# Patient Record
Sex: Male | Born: 1945 | Race: White | Hispanic: No | Marital: Married | Smoking: Light tobacco smoker
Health system: Southern US, Community
[De-identification: ages and names within clinical notes are randomized; demographics above are authoritative.]

## PROBLEM LIST (undated history)

## (undated) DIAGNOSIS — Z87442 Personal history of urinary calculi: Secondary | ICD-10-CM

## (undated) DIAGNOSIS — C801 Malignant (primary) neoplasm, unspecified: Secondary | ICD-10-CM

## (undated) DIAGNOSIS — F419 Anxiety disorder, unspecified: Secondary | ICD-10-CM

## (undated) DIAGNOSIS — E119 Type 2 diabetes mellitus without complications: Secondary | ICD-10-CM

## (undated) DIAGNOSIS — R011 Cardiac murmur, unspecified: Secondary | ICD-10-CM

## (undated) DIAGNOSIS — K219 Gastro-esophageal reflux disease without esophagitis: Secondary | ICD-10-CM

## (undated) DIAGNOSIS — I10 Essential (primary) hypertension: Secondary | ICD-10-CM

## (undated) DIAGNOSIS — M1990 Unspecified osteoarthritis, unspecified site: Secondary | ICD-10-CM

## (undated) DIAGNOSIS — R0600 Dyspnea, unspecified: Secondary | ICD-10-CM

## (undated) DIAGNOSIS — K7460 Unspecified cirrhosis of liver: Secondary | ICD-10-CM

## (undated) DIAGNOSIS — J189 Pneumonia, unspecified organism: Secondary | ICD-10-CM

## (undated) HISTORY — DX: Essential (primary) hypertension: I10

## (undated) HISTORY — PX: CARDIAC CATHETERIZATION: SHX172

## (undated) HISTORY — PX: CARDIAC VALVE REPLACEMENT: SHX585

## (undated) HISTORY — PX: COLON SURGERY: SHX602

---

## 2018-12-23 ENCOUNTER — Ambulatory Visit: Payer: Medicare Other | Admitting: Cardiovascular Disease

## 2019-01-19 ENCOUNTER — Encounter: Payer: Self-pay | Admitting: Cardiovascular Disease

## 2019-01-19 ENCOUNTER — Ambulatory Visit (INDEPENDENT_AMBULATORY_CARE_PROVIDER_SITE_OTHER): Payer: Medicare Other | Admitting: Cardiovascular Disease

## 2019-01-19 ENCOUNTER — Other Ambulatory Visit: Payer: Self-pay

## 2019-01-19 VITALS — BP 152/74 | HR 107 | Ht 70.0 in | Wt 160.6 lb

## 2019-01-19 DIAGNOSIS — Z952 Presence of prosthetic heart valve: Secondary | ICD-10-CM

## 2019-01-19 DIAGNOSIS — Z01812 Encounter for preprocedural laboratory examination: Secondary | ICD-10-CM | POA: Diagnosis not present

## 2019-01-19 HISTORY — DX: Presence of prosthetic heart valve: Z95.2

## 2019-01-19 MED ORDER — SODIUM CHLORIDE 0.9% FLUSH: 3.0000 mL | Freq: Two times a day (BID) | INTRAVENOUS | Status: DC

## 2019-01-19 NOTE — Assessment & Plan Note (Signed)
Mr. Lacinda Axon was referred to me by Dr. 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  He had bioprosthetic AVR in [place] 4/15.  Apparently he underwent cardiac cath prior to that revealing normal coronary arteries.  He has had increasing dyspnea on exertion and chest pressure.  2D echo performed by Dr. Annie Main revealed normal EF with a increase in his gradient from 20 to 40 mmHg.  He was referred for TEE, right left heart cath.  I have reviewed the risks, indications, and alternatives to cardiac catheterization, possible angioplasty, and stenting with the patient. Risks include but are not limited to bleeding, infection, vascular injury, stroke, myocardial infection, arrhythmia, kidney injury, radiation-related injury in the case of prolonged fluoroscopy use, emergency cardiac surgery, and death. The patient understands the risks of serious complication is 1-2 in 123XX123 with diagnostic cardiac cath and 1-2% or less with angioplasty/stenting.

## 2019-01-19 NOTE — Progress Notes (Signed)
01/19/2019 Tony [name]   11/15/1945  [id number]  Primary Physician Windell Hummingbird, PA-C Primary Cardiologist: Lorretta Harp MD Lupe Carney, [place]  HPI:  Tony [name] is a 73 y.o. married Caucasian male father of 2 children, grandfather of 95 grandchildren referred by Dr. Claudie Leach for TEE, right left heart cath because of progressive prosthetic aortic valves stenosis and associated symptoms of dyspnea and chest pressure.  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  Mr. Factors include tobacco abuse currently smoking 2 cigars a week previously up to 8-10.  He has treated hypertension, diabetes and hyperlipidemia.  He is never had a heart attack or stroke.  His brother did have bicuspid aortic stenosis and AVR at age 6.  2D echo performed by Dr. Claudie Leach revealed preserved LV function with a worsening aortic valve gradient.  Current Meds  Medication Sig  . aspirin EC 81 MG tablet Take by mouth.  . Coenzyme Q10 10 MG capsule Take by mouth.  . escitalopram (LEXAPRO) 20 MG tablet Take by mouth.  . Evolocumab (REPATHA SURECLICK) [id number] MG/ML SOAJ Inject into the skin.  . hydrochlorothiazide (HYDRODIURIL) 12.5 MG tablet Take by mouth.  . insulin glargine (LANTUS) 100 UNIT/ML injection Inject into the skin.  . metFORMIN (GLUCOPHAGE) 1000 MG tablet Take by mouth.  . metoprolol succinate (TOPROL XL) 50 MG 24 hr tablet Take by mouth.     Allergies  Allergen Reactions  . Codeine Other (See Comments) and Rash    Hallucinations     Social History   Socioeconomic History  . Marital status: Married    Spouse name: Not on file  . Number of children: Not on file  . Years of education: Not on file  . Highest education level: Not on file  Occupational History  . Not on file  Social Needs  . Financial resource strain: Not on file  . Food insecurity    Worry: Not on file    Inability: Not on file  . Transportation needs   Medical: Not on file    Non-medical: Not on file  Tobacco Use  . Smoking status: Light Tobacco Smoker  . Smokeless tobacco: Never Used  . Tobacco comment: cigars 2 a week   Substance and Sexual Activity  . Alcohol use: Not on file  . Drug use: Not on file  . Sexual activity: Not on file  Lifestyle  . Physical activity    Days per week: Not on file    Minutes per session: Not on file  . Stress: Not on file  Relationships  . Social Herbalist on phone: Not on file    Gets together: Not on file    Attends religious service: Not on file    Active member of club or organization: Not on file    Attends meetings of clubs or organizations: Not on file    Relationship status: Not on file  . Intimate partner violence    Fear of current or ex partner: Not on file    Emotionally abused: Not on file    Physically abused: Not on file    Forced sexual activity: Not on file  Other Topics Concern  . Not on file  Social History Narrative  . Not on file     Review of Systems: General: negative for chills, fever, night sweats or weight changes.  Cardiovascular: negative for chest pain,  dyspnea on exertion, edema, orthopnea, palpitations, paroxysmal nocturnal dyspnea or shortness of breath Dermatological: negative for rash Respiratory: negative for cough or wheezing Urologic: negative for hematuria Abdominal: negative for nausea, vomiting, diarrhea, bright red blood per rectum, melena, or hematemesis Neurologic: negative for visual changes, syncope, or dizziness All other systems reviewed and are otherwise negative except as noted above.    Blood pressure (!) 152/74, pulse (!) 107, height 5\' 10"  (1.778 m), weight 160 lb 9.6 oz (72.8 kg), SpO2 94 %.  General appearance: alert and no distress Neck: no adenopathy, no JVD, supple, symmetrical, trachea midline, thyroid not enlarged, symmetric, no tenderness/mass/nodules and Left carotid bruit versus transmitted murmur Lungs: clear to  auscultation bilaterally Heart: 2/6 systolic ejection murmur at the base consistent with aortic stenosis Extremities: extremities normal, atraumatic, no cyanosis or edema Pulses: 2+ and symmetric Skin: Skin color, texture, turgor normal. No rashes or lesions Neurologic: Alert and oriented X 3, normal strength and tone. Normal symmetric reflexes. Normal coordination and gait  EKG sinus tachycardia at 107 with evidence of LVH with repolarization changes.  I personally reviewed this EKG.  ASSESSMENT AND PLAN:   H/O aortic valve replacement [name] was referred to me by Dr. 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  He had bioprosthetic AVR in [place] 4/15.  Apparently he underwent cardiac cath prior to that revealing normal coronary arteries.  He has had increasing dyspnea on exertion and chest pressure.  2D echo performed by Dr. Annie Main revealed normal EF with a increase in his gradient from 20 to 40 mmHg.  He was referred for TEE, right left heart cath.  I have reviewed the risks, indications, and alternatives to cardiac catheterization, possible angioplasty, and stenting with the patient. Risks include but are not limited to bleeding, infection, vascular injury, stroke, myocardial infection, arrhythmia, kidney injury, radiation-related injury in the case of prolonged fluoroscopy use, emergency cardiac surgery, and death. The patient understands the risks of serious complication is 1-2 in 123XX123 with diagnostic cardiac cath and 1-2% or less with angioplasty/stenting.       Lorretta Harp MD FACP,FACC,FAHA, [facility] 01/19/2019 2:14 PM

## 2019-01-19 NOTE — Patient Instructions (Addendum)
Medication Instructions:  Your physician recommends that you continue on your current medications as directed. Please refer to the Current Medication list given to you today.  If you need a refill on your cardiac medications before your next appointment, please call your pharmacy.   Lab work: CBC, BMET If you have labs (blood work) drawn today and your tests are completely normal, you will receive your results only by: [place] (if you have MyChart) OR A paper copy in the mail If you have any lab test that is abnormal or we need to change your treatment, we will call you to review the results.  Testing/Procedures: Your physician has requested that you have a cardiac catheterization. Cardiac catheterization is used to diagnose and/or treat various heart conditions. Doctors may recommend this procedure for a number of different reasons. The most common reason is to evaluate chest pain. Chest pain can be a symptom of coronary artery disease (CAD), and cardiac catheterization can show whether plaque is narrowing or blocking your heart's arteries. This procedure is also used to evaluate the valves, as well as measure the blood flow and oxygen levels in different parts of your heart. For further information please visit [email or web address]. Please follow instruction sheet, as given.  AND  Your physician has requested that you have a TEE. During a TEE, sound waves are used to create images of your heart. It provides your doctor with information about the size and shape of your heart and how well your heart's chambers and valves are working. In this test, a transducer is attached to the end of a flexible tube that's guided down your throat and into your esophagus (the tube leading from you mouth to your stomach) to get a more detailed image of your heart. You are not awake for the procedure. Please see the instruction sheet given to you today. For further information please visit  [email or web address].   Follow-Up: At [facility], you and your health needs are our priority.  As part of our continuing mission to provide you with exceptional heart care, we have created designated Provider Care Teams.  These Care Teams include your primary Cardiologist (physician) and Advanced Practice Providers (APPs -  Physician Assistants and Nurse Practitioners) who all work together to provide you with the care you need, when you need it. You may see Dr Gwenlyn Found or one of the following Advanced Practice Providers on your designated Care Team:    Kerin Ransom, PA-C  [place], [place]  Coletta Memos, [place]  Any Other Special Instructions Will Be Listed Below (If Applicable).  You are scheduled for a TEE/Cardioversion/TEE Cardioversion on 01/29/2019 with Dr. Stanford Breed.  Please arrive at the [facility] (Main Entrance A) at [facility]: [address] [place], [place] [address] at 6:30am am. (1 hour prior to procedure unless lab work is needed; if lab work is needed arrive 1.5 hours ahead)  DIET: Nothing to eat or drink after midnight except a sip of water with medications (see medication instructions below)  Medication Instructions: Hold Diabetic Medications: Lantus, Glucophage  Continue your anticoagulant: Aspirin You will need to continue your anticoagulant after your procedure until you  are told by your  Provider that it is safe to stop   Labs: BMET and CBC  You must have a responsible person to drive you home and stay in the waiting area during your procedure. Failure to do so could result in cancellation.  Bring your insurance cards.  *Special Note: Every effort is  made to have your procedure done on time. Occasionally there are emergencies that occur at the hospital that may cause delays. Please be patient if a delay does occur.   You are scheduled for a Cardiac Catheterization on Thursday, December 3 with Dr. Quay Burow.  1. Please arrive at the [facility] (Main Entrance A) at [facility]: [address] [place], [place] [address] at 11:30 AM (This time is two hours before your procedure to ensure your preparation). Free valet parking service is available.   Special note: Every effort is made to have your procedure done on time. Please understand that emergencies sometimes delay scheduled procedures.  2. Diet: Do not eat solid foods after midnight.  The patient may have clear liquids until 5am upon the day of the procedure.  3. Labs: CBC, BMET  4. Medication instructions in preparation for your procedure: Hold Metformin and Lantus  On the morning of your procedure, take your Aspirin and any morning medicines NOT listed above.  You may use sips of water.  5. Plan for one night stay--bring personal belongings. 6. Bring a current list of your medications and current insurance cards. 7. You MUST have a responsible person to drive you home. 8. Someone MUST be with you the first 24 hours after you arrive home or your discharge will be delayed. 9. Please wear clothes that are easy to get on and off and wear slip-on shoes.  YOU WILL HAVE TO BE TESTED FOR COVID ON Monday November 30TH AT 12:50PM. THIS IS LOCATED AT [facility]. ([place]) [place], [place], [id number] Thank you for allowing [place] to care for you!   -- [facility]

## 2019-01-20 LAB — CBC
Hematocrit: 44.1 % (ref 37.5–51.0)
Hemoglobin: 14.9 g/dL (ref 13.0–17.7)
MCH: 31.2 pg (ref 26.6–33.0)
MCHC: 33.8 g/dL (ref 31.5–35.7)
MCV: 93 fL (ref 79–97)
Platelets: 172 10*3/uL (ref 150–450)
RBC: 4.77 x10E6/uL (ref 4.14–5.80)
RDW: 13.7 % (ref 11.6–15.4)
WBC: 7.8 10*3/uL (ref 3.4–10.8)

## 2019-01-20 LAB — BASIC METABOLIC PANEL
BUN/Creatinine Ratio: 12 (ref 10–24)
BUN: 10 mg/dL (ref 8–27)
CO2: 23 mmol/L (ref 20–29)
Calcium: 9.5 mg/dL (ref 8.6–10.2)
Chloride: 96 mmol/L (ref 96–106)
Creatinine, Ser: 0.83 mg/dL (ref 0.76–1.27)
GFR calc Af Amer: 102 mL/min/{1.73_m2} (ref >59)
GFR calc non Af Amer: 89 mL/min/{1.73_m2} (ref >59)
Glucose: 98 mg/dL (ref 65–99)
Potassium: 4.7 mmol/L (ref 3.5–5.2)
Sodium: 138 mmol/L (ref 134–144)

## 2019-01-26 ENCOUNTER — Encounter: Payer: Self-pay | Admitting: *Deleted

## 2019-01-26 ENCOUNTER — Other Ambulatory Visit (HOSPITAL_COMMUNITY): Payer: Medicare Other

## 2019-01-27 ENCOUNTER — Other Ambulatory Visit (HOSPITAL_COMMUNITY)
Admission: RE | Admit: 2019-01-27 | Discharge: 2019-01-27 | Disposition: A | Discharge status: Home / Self Care | Payer: Medicare Other | Source: Ambulatory Visit | Attending: Cardiovascular Disease | Admitting: Cardiovascular Disease

## 2019-01-27 DIAGNOSIS — Z01812 Encounter for preprocedural laboratory examination: Secondary | ICD-10-CM | POA: Diagnosis present

## 2019-01-27 DIAGNOSIS — Z20828 Contact with and (suspected) exposure to other viral communicable diseases: Secondary | ICD-10-CM | POA: Insufficient documentation

## 2019-01-28 ENCOUNTER — Telehealth: Payer: Self-pay | Admitting: *Deleted

## 2019-01-28 LAB — SARS CORONAVIRUS 2 (TAT 6-24 HRS): SARS Coronavirus 2: NEGATIVE

## 2019-01-28 NOTE — Telephone Encounter (Signed)
Pt contacted pre-catheterization scheduled at [facility] for: Thursday January 29, 2019 1:30 PM Verified arrival time and place: [place] Entrance A [facility]) at: 6:30 AM/TEE 7:30 AM   Nothing to eat or drink  after midnight prior to cath, sips of water to take medications Contrast allergy: no  Hold: Metformin-day of procedure and 48 hours post procedure. Insulin-AM of procedure. HCTZ-AM of procedure.  Except hold medications AM meds can be  taken pre-cath with sip of water including: ASA 81 mg   Confirmed patient has responsible adult to drive home post procedure and observe 24 hours after arriving home: yes  Currently, due to Covid-19 pandemic, only one support person will be allowed with patient. Must be the same support person for that patient's entire stay, will be screened and required to wear a mask. They will be asked to wait in the waiting room for the duration of the patient's stay.  Patients are required to wear a mask when they enter the hospital.      COVID-19 Pre-Screening Questions:  . In the past 7 to 10 days have you had a cough,  shortness of breath, headache, congestion, fever (100 or greater) body aches, chills, sore throat, or sudden loss of taste or sense of smell? no . Have you been around anyone with known Covid 19? no . Have you been around anyone who is awaiting Covid 19 test results in the past 7 to 10 days? no . Have you been around anyone who has been exposed to Covid 19, or has mentioned symptoms of Covid 19 within the past 7 to 10 days? no    I reviewed procedure/mask/visitor instructions, Covid-19 screening questions with patient's wife, Stanton Kidney, she verbalized understanding, thanked me for call.

## 2019-01-28 NOTE — Telephone Encounter (Signed)
Dr. Kennon Holter patient

## 2019-01-28 NOTE — Telephone Encounter (Signed)
Pt's wife (DPR), Stanton Kidney, states patient is currently being evaluated for dementia/cognitive impairment. She is concerned that patient may not be able to understand questions and answer correctly. She is concerned that patient may not understand any medical information or instructions he is given.  Pt's wife advised I will forward to Dr Gwenlyn Found and [facility] Cath Lab for their review.

## 2019-01-29 ENCOUNTER — Ambulatory Visit (HOSPITAL_COMMUNITY)
Admission: RE | Admit: 2019-01-29 | Discharge: 2019-01-29 | Disposition: A | Discharge status: Home / Self Care | Payer: Medicare Other | Attending: Cardiology | Admitting: Cardiology

## 2019-01-29 ENCOUNTER — Other Ambulatory Visit: Payer: Self-pay

## 2019-01-29 ENCOUNTER — Ambulatory Visit (HOSPITAL_BASED_OUTPATIENT_CLINIC_OR_DEPARTMENT_OTHER)
Admission: RE | Admit: 2019-01-29 | Discharge: 2019-01-29 | Disposition: A | Discharge status: Home / Self Care | Payer: Medicare Other | Source: Home / Self Care | Attending: Cardiology | Admitting: Cardiology

## 2019-01-29 ENCOUNTER — Encounter (HOSPITAL_COMMUNITY)
Admission: RE | Disposition: A | Discharge status: Home / Self Care | Payer: Self-pay | Source: Home / Self Care | Attending: Cardiology

## 2019-01-29 ENCOUNTER — Encounter (HOSPITAL_COMMUNITY): Payer: Self-pay

## 2019-01-29 ENCOUNTER — Encounter (HOSPITAL_COMMUNITY)
Admission: RE | Disposition: A | Discharge status: Home / Self Care | Payer: Medicare Other | Source: Home / Self Care | Attending: Cardiology

## 2019-01-29 DIAGNOSIS — Z79899 Other long term (current) drug therapy: Secondary | ICD-10-CM | POA: Diagnosis not present

## 2019-01-29 DIAGNOSIS — Z952 Presence of prosthetic heart valve: Secondary | ICD-10-CM

## 2019-01-29 DIAGNOSIS — E119 Type 2 diabetes mellitus without complications: Secondary | ICD-10-CM | POA: Diagnosis not present

## 2019-01-29 DIAGNOSIS — I080 Rheumatic disorders of both mitral and aortic valves: Secondary | ICD-10-CM | POA: Diagnosis not present

## 2019-01-29 DIAGNOSIS — Z953 Presence of xenogenic heart valve: Secondary | ICD-10-CM | POA: Diagnosis not present

## 2019-01-29 DIAGNOSIS — I10 Essential (primary) hypertension: Secondary | ICD-10-CM | POA: Insufficient documentation

## 2019-01-29 DIAGNOSIS — F17290 Nicotine dependence, other tobacco product, uncomplicated: Secondary | ICD-10-CM | POA: Insufficient documentation

## 2019-01-29 DIAGNOSIS — E785 Hyperlipidemia, unspecified: Secondary | ICD-10-CM | POA: Diagnosis not present

## 2019-01-29 DIAGNOSIS — Z885 Allergy status to narcotic agent status: Secondary | ICD-10-CM | POA: Insufficient documentation

## 2019-01-29 DIAGNOSIS — I340 Nonrheumatic mitral (valve) insufficiency: Secondary | ICD-10-CM | POA: Diagnosis not present

## 2019-01-29 DIAGNOSIS — Z01812 Encounter for preprocedural laboratory examination: Secondary | ICD-10-CM

## 2019-01-29 DIAGNOSIS — Z7982 Long term (current) use of aspirin: Secondary | ICD-10-CM | POA: Diagnosis not present

## 2019-01-29 DIAGNOSIS — Z794 Long term (current) use of insulin: Secondary | ICD-10-CM | POA: Insufficient documentation

## 2019-01-29 DIAGNOSIS — I350 Nonrheumatic aortic (valve) stenosis: Secondary | ICD-10-CM | POA: Diagnosis not present

## 2019-01-29 HISTORY — PX: TEE WITHOUT CARDIOVERSION: SHX5443

## 2019-01-29 HISTORY — DX: Type 2 diabetes mellitus without complications: E11.9

## 2019-01-29 HISTORY — PX: RIGHT/LEFT HEART CATH AND CORONARY ANGIOGRAPHY: CATH118266

## 2019-01-29 LAB — POCT I-STAT, CHEM 8
BUN: 12 mg/dL (ref 8–23)
Calcium, Ion: 1.16 mmol/L (ref 1.15–1.40)
Chloride: 93 mmol/L — ABNORMAL LOW (ref 98–111)
Creatinine, Ser: 0.6 mg/dL — ABNORMAL LOW (ref 0.61–1.24)
Glucose, Bld: 203 mg/dL — ABNORMAL HIGH (ref 70–99)
HCT: 38 % — ABNORMAL LOW (ref 39.0–52.0)
Hemoglobin: 12.9 g/dL — ABNORMAL LOW (ref 13.0–17.0)
Potassium: 3.9 mmol/L (ref 3.5–5.1)
Sodium: 130 mmol/L — ABNORMAL LOW (ref 135–145)
TCO2: 24 mmol/L (ref 22–32)

## 2019-01-29 LAB — POCT I-STAT EG7
Acid-base deficit: 1 mmol/L (ref 0.0–2.0)
Bicarbonate: 24.3 mmol/L (ref 20.0–28.0)
Bicarbonate: 25.6 mmol/L (ref 20.0–28.0)
Calcium, Ion: 1.12 mmol/L — ABNORMAL LOW (ref 1.15–1.40)
Calcium, Ion: 1.16 mmol/L (ref 1.15–1.40)
HCT: 41 % (ref 39.0–52.0)
HCT: 41 % (ref 39.0–52.0)
Hemoglobin: 13.9 g/dL (ref 13.0–17.0)
Hemoglobin: 13.9 g/dL (ref 13.0–17.0)
O2 Saturation: 67 %
O2 Saturation: 69 %
Potassium: 4.1 mmol/L (ref 3.5–5.1)
Potassium: 4.2 mmol/L (ref 3.5–5.1)
Sodium: 136 mmol/L (ref 135–145)
Sodium: 137 mmol/L (ref 135–145)
TCO2: 26 mmol/L (ref 22–32)
TCO2: 27 mmol/L (ref 22–32)
pCO2, Ven: 41.4 mmHg — ABNORMAL LOW (ref 44.0–60.0)
pCO2, Ven: 42.9 mmHg — ABNORMAL LOW (ref 44.0–60.0)
pH, Ven: 7.377 (ref 7.250–7.430)
pH, Ven: 7.384 (ref 7.250–7.430)
pO2, Ven: 35 mmHg (ref 32.0–45.0)
pO2, Ven: 37 mmHg (ref 32.0–45.0)

## 2019-01-29 LAB — GLUCOSE, CAPILLARY: Glucose-Capillary: 256 mg/dL — ABNORMAL HIGH (ref 70–99)

## 2019-01-29 LAB — POCT I-STAT 7, (LYTES, BLD GAS, ICA,H+H)
Acid-base deficit: 3 mmol/L — ABNORMAL HIGH (ref 0.0–2.0)
Bicarbonate: 22.2 mmol/L (ref 20.0–28.0)
Calcium, Ion: 1.14 mmol/L — ABNORMAL LOW (ref 1.15–1.40)
HCT: 38 % — ABNORMAL LOW (ref 39.0–52.0)
Hemoglobin: 12.9 g/dL — ABNORMAL LOW (ref 13.0–17.0)
O2 Saturation: 98 %
Potassium: 3.9 mmol/L (ref 3.5–5.1)
Sodium: 130 mmol/L — ABNORMAL LOW (ref 135–145)
TCO2: 23 mmol/L (ref 22–32)
pCO2 arterial: 38.4 mmHg (ref 32.0–48.0)
pH, Arterial: 7.37 (ref 7.350–7.450)
pO2, Arterial: 112 mmHg — ABNORMAL HIGH (ref 83.0–108.0)

## 2019-01-29 SURGERY — ECHOCARDIOGRAM, TRANSESOPHAGEAL
Anesthesia: Moderate Sedation

## 2019-01-29 SURGERY — RIGHT/LEFT HEART CATH AND CORONARY ANGIOGRAPHY
Anesthesia: LOCAL

## 2019-01-29 MED ORDER — ACETAMINOPHEN 325 MG PO TABS: 650.0000 mg | ORAL_TABLET | ORAL | Status: DC | PRN

## 2019-01-29 MED ORDER — BUTAMBEN-TETRACAINE-BENZOCAINE 2-2-14 % EX AERO
INHALATION_SPRAY | CUTANEOUS | Status: DC | PRN
  Administered 2019-01-29: 2 via TOPICAL

## 2019-01-29 MED ORDER — DIPHENHYDRAMINE HCL 50 MG/ML IJ SOLN
INTRAMUSCULAR | Status: AC
  Filled 2019-01-29: qty 1

## 2019-01-29 MED ORDER — HEPARIN (PORCINE) IN NACL 2000-0.9 UNIT/L-% IV SOLN
INTRAVENOUS | Status: AC
  Filled 2019-01-29: qty 1000

## 2019-01-29 MED ORDER — VERAPAMIL HCL 2.5 MG/ML IV SOLN
INTRA_ARTERIAL | Status: DC | PRN
  Administered 2019-01-29: 5 mL via INTRA_ARTERIAL

## 2019-01-29 MED ORDER — MIDAZOLAM HCL (PF) 10 MG/2ML IJ SOLN
INTRAMUSCULAR | Status: DC | PRN
  Administered 2019-01-29 (×2): 2 mg via INTRAVENOUS
  Administered 2019-01-29 (×2): 1 mg via INTRAVENOUS

## 2019-01-29 MED ORDER — HEPARIN (PORCINE) IN NACL 1000-0.9 UT/500ML-% IV SOLN
INTRAVENOUS | Status: AC
  Filled 2019-01-29: qty 1000

## 2019-01-29 MED ORDER — ASPIRIN 81 MG PO CHEW: 81.0000 mg | CHEWABLE_TABLET | Freq: Every day | ORAL | Status: DC

## 2019-01-29 MED ORDER — SODIUM CHLORIDE 0.9 % WEIGHT BASED INFUSION
3.0000 mL/kg/h | INTRAVENOUS | Status: AC
  Administered 2019-01-29: 3 mL/kg/h via INTRAVENOUS

## 2019-01-29 MED ORDER — LABETALOL HCL 5 MG/ML IV SOLN: 10.0000 mg | INTRAVENOUS | Status: DC | PRN

## 2019-01-29 MED ORDER — SODIUM CHLORIDE 0.9 % IV SOLN: 250.0000 mL | INTRAVENOUS | Status: DC | PRN

## 2019-01-29 MED ORDER — HYDRALAZINE HCL 20 MG/ML IJ SOLN: 10.0000 mg | INTRAMUSCULAR | Status: DC | PRN

## 2019-01-29 MED ORDER — ASPIRIN 81 MG PO CHEW
81.0000 mg | CHEWABLE_TABLET | ORAL | Status: AC
  Administered 2019-01-29: 81 mg via ORAL

## 2019-01-29 MED ORDER — SODIUM CHLORIDE 0.9 % IV SOLN: INTRAVENOUS | Status: DC

## 2019-01-29 MED ORDER — MIDAZOLAM HCL (PF) 5 MG/ML IJ SOLN
INTRAMUSCULAR | Status: AC
  Filled 2019-01-29: qty 2

## 2019-01-29 MED ORDER — VERAPAMIL HCL 2.5 MG/ML IV SOLN: INTRAVENOUS | Status: DC | PRN

## 2019-01-29 MED ORDER — ONDANSETRON HCL 4 MG/2ML IJ SOLN: 4.0000 mg | Freq: Four times a day (QID) | INTRAMUSCULAR | Status: DC | PRN

## 2019-01-29 MED ORDER — ASPIRIN 81 MG PO CHEW
CHEWABLE_TABLET | ORAL | Status: AC
  Filled 2019-01-29: qty 1

## 2019-01-29 MED ORDER — IOHEXOL 350 MG/ML SOLN
INTRAVENOUS | Status: DC | PRN
  Administered 2019-01-29: 55 mL

## 2019-01-29 MED ORDER — FENTANYL CITRATE (PF) 100 MCG/2ML IJ SOLN
INTRAMUSCULAR | Status: DC | PRN
  Administered 2019-01-29: 25 ug via INTRAVENOUS

## 2019-01-29 MED ORDER — FENTANYL CITRATE (PF) 100 MCG/2ML IJ SOLN
INTRAMUSCULAR | Status: AC
  Filled 2019-01-29: qty 2

## 2019-01-29 MED ORDER — SODIUM CHLORIDE 0.9% FLUSH: 3.0000 mL | Freq: Two times a day (BID) | INTRAVENOUS | Status: DC

## 2019-01-29 MED ORDER — SODIUM CHLORIDE 0.9% FLUSH: 3.0000 mL | INTRAVENOUS | Status: DC | PRN

## 2019-01-29 MED ORDER — VERAPAMIL HCL 2.5 MG/ML IV SOLN
INTRAVENOUS | Status: AC
  Filled 2019-01-29: qty 2

## 2019-01-29 MED ORDER — NITROGLYCERIN 1 MG/10 ML FOR IR/CATH LAB
INTRA_ARTERIAL | Status: AC
  Filled 2019-01-29: qty 10

## 2019-01-29 MED ORDER — FENTANYL CITRATE (PF) 100 MCG/2ML IJ SOLN
INTRAMUSCULAR | Status: DC | PRN
  Administered 2019-01-29 (×2): 25 ug via INTRAVENOUS

## 2019-01-29 MED ORDER — HEPARIN SODIUM (PORCINE) 1000 UNIT/ML IJ SOLN
INTRAMUSCULAR | Status: DC | PRN
  Administered 2019-01-29: 3500 [IU] via INTRAVENOUS

## 2019-01-29 MED ORDER — HEPARIN (PORCINE) IN NACL 1000-0.9 UT/500ML-% IV SOLN
INTRAVENOUS | Status: DC | PRN
  Administered 2019-01-29 (×3): 500 mL

## 2019-01-29 MED ORDER — MIDAZOLAM HCL 2 MG/2ML IJ SOLN
INTRAMUSCULAR | Status: DC | PRN
  Administered 2019-01-29: 0.5 mg via INTRAVENOUS

## 2019-01-29 MED ORDER — MIDAZOLAM HCL 2 MG/2ML IJ SOLN
INTRAMUSCULAR | Status: AC
  Filled 2019-01-29: qty 2

## 2019-01-29 MED ORDER — HEPARIN SODIUM (PORCINE) 1000 UNIT/ML IJ SOLN
INTRAMUSCULAR | Status: AC
  Filled 2019-01-29: qty 1

## 2019-01-29 MED ORDER — SODIUM CHLORIDE 0.9 % WEIGHT BASED INFUSION
1.0000 mL/kg/h | INTRAVENOUS | Status: DC
  Administered 2019-01-29: 1 mL/kg/h via INTRAVENOUS

## 2019-01-29 MED ORDER — LIDOCAINE HCL (PF) 1 % IJ SOLN
INTRAMUSCULAR | Status: DC | PRN
  Administered 2019-01-29 (×2): 2 mL

## 2019-01-29 MED ORDER — LIDOCAINE HCL (PF) 1 % IJ SOLN
INTRAMUSCULAR | Status: AC
  Filled 2019-01-29: qty 30

## 2019-01-29 SURGICAL SUPPLY — 13 items
CATH BALLN WEDGE 5F 110CM (CATHETERS) ×2 IMPLANT
CATH OPTITORQUE TIG 4.0 5F (CATHETERS) ×2 IMPLANT
DEVICE RAD COMP TR BAND LRG (VASCULAR PRODUCTS) ×2 IMPLANT
GLIDESHEATH SLEND A-KIT 6F 22G (SHEATH) ×2 IMPLANT
GLIDESHEATH SLEND SS 6F .021 (SHEATH) ×2 IMPLANT
GUIDEWIRE INQWIRE 1.5J.035X260 (WIRE) ×1 IMPLANT
INQWIRE 1.5J .035X260CM (WIRE) ×2
KIT HEART LEFT (KITS) ×2 IMPLANT
PACK CARDIAC CATHETERIZATION (CUSTOM PROCEDURE TRAY) ×2 IMPLANT
SHEATH GLIDE SLENDER 4/5FR (SHEATH) ×2 IMPLANT
TRANSDUCER W/STOPCOCK (MISCELLANEOUS) ×2 IMPLANT
TUBING CIL FLEX 10 FLL-RA (TUBING) ×2 IMPLANT
WIRE HI TORQ VERSACORE-J 145CM (WIRE) ×2 IMPLANT

## 2019-01-29 NOTE — H&P (Signed)
Office Visit  01/19/2019 [facility] Seymour Bars, MD Cardiology  Pre-procedure lab exam +1 more Dx  Referred by Franchot Mimes, MD Reason for Visit  Additional Documentation  Vitals:   BP 152/74   Pulse 107   Ht 5\' 10"  (1.778 m)  Wt 72.8 kg  SpO2 94%  BMI 23.04 kg/m  BSA 1.9 m  Flowsheets:   NEWS,  MEWS Score,  Anthropometrics,  Method of Visit    Encounter Info:   Billing Info,  History,  Allergies,  Detailed Report    All Notes  Progress Notes by Lorretta Harp, MD at 01/19/2019 1:45 PM Author: Lorretta Harp, MD Author Type: Physician Filed: 01/19/2019 3:14 PM  Note Status: Signed Cosign: Cosign Not Required Encounter Date: 01/19/2019  Editor: Lorretta Harp, MD (Physician)  Expand All Collapse All       01/19/2019 Tony [name]   1945-07-11  [id number]  Primary Physician Windell Hummingbird, PA-C Primary Cardiologist: Lorretta Harp MD Lupe Carney, [place]  HPI:  Tony [name] is a 73 y.o. married Caucasian male father of 2 children, grandfather of 75 grandchildren referred by Dr. Claudie Leach for TEE, right left heart cath because of progressive prosthetic aortic valves stenosis and associated symptoms of dyspnea and chest pressure.  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  Mr. Factors include tobacco abuse currently smoking 2 cigars a week previously up to 8-10.  He has treated hypertension, diabetes and hyperlipidemia.  He is never had a heart attack or stroke.  His brother did have bicuspid aortic stenosis and AVR at age 67.  2D echo performed by Dr. Claudie Leach revealed preserved LV function with a worsening aortic valve gradient.  Active Medications      Current Meds  Medication Sig  . aspirin EC 81 MG tablet Take by mouth.  . Coenzyme Q10 10 MG capsule Take by mouth.  . escitalopram (LEXAPRO) 20 MG tablet Take by mouth.  . Evolocumab (REPATHA SURECLICK) [id number]  MG/ML SOAJ Inject into the skin.  . hydrochlorothiazide (HYDRODIURIL) 12.5 MG tablet Take by mouth.  . insulin glargine (LANTUS) 100 UNIT/ML injection Inject into the skin.  . metFORMIN (GLUCOPHAGE) 1000 MG tablet Take by mouth.  . metoprolol succinate (TOPROL XL) 50 MG 24 hr tablet Take by mouth.            Allergies  Allergen Reactions  . Codeine Other (See Comments) and Rash    Hallucinations     Social History        Socioeconomic History  . Marital status: Married    Spouse name: Not on file  . Number of children: Not on file  . Years of education: Not on file  . Highest education level: Not on file  Occupational History  . Not on file  Social Needs  . Financial resource strain: Not on file  . Food insecurity    Worry: Not on file    Inability: Not on file  . Transportation needs    Medical: Not on file    Non-medical: Not on file  Tobacco Use  . Smoking status: Light Tobacco Smoker  . Smokeless tobacco: Never Used  . Tobacco comment: cigars 2 a week   Substance and Sexual Activity  . Alcohol use: Not on file  . Drug use: Not on file  . Sexual activity: Not on file  Lifestyle  . Physical  activity    Days per week: Not on file    Minutes per session: Not on file  . Stress: Not on file  Relationships  . Social Herbalist on phone: Not on file    Gets together: Not on file    Attends religious service: Not on file    Active member of club or organization: Not on file    Attends meetings of clubs or organizations: Not on file    Relationship status: Not on file  . Intimate partner violence    Fear of current or ex partner: Not on file    Emotionally abused: Not on file    Physically abused: Not on file    Forced sexual activity: Not on file  Other Topics Concern  . Not on file  Social History Narrative  . Not on file     Review of Systems: General: negative for chills, fever, night sweats or weight  changes.  Cardiovascular: negative for chest pain, dyspnea on exertion, edema, orthopnea, palpitations, paroxysmal nocturnal dyspnea or shortness of breath Dermatological: negative for rash Respiratory: negative for cough or wheezing Urologic: negative for hematuria Abdominal: negative for nausea, vomiting, diarrhea, bright red blood per rectum, melena, or hematemesis Neurologic: negative for visual changes, syncope, or dizziness All other systems reviewed and are otherwise negative except as noted above.    Blood pressure (!) 152/74, pulse (!) 107, height 5\' 10"  (1.778 m), weight 160 lb 9.6 oz (72.8 kg), SpO2 94 %.  General appearance: alert and no distress Neck: no adenopathy, no JVD, supple, symmetrical, trachea midline, thyroid not enlarged, symmetric, no tenderness/mass/nodules and Left carotid bruit versus transmitted murmur Lungs: clear to auscultation bilaterally Heart: 2/6 systolic ejection murmur at the base consistent with aortic stenosis Extremities: extremities normal, atraumatic, no cyanosis or edema Pulses: 2+ and symmetric Skin: Skin color, texture, turgor normal. No rashes or lesions Neurologic: Alert and oriented X 3, normal strength and tone. Normal symmetric reflexes. Normal coordination and gait  EKG sinus tachycardia at 107 with evidence of LVH with repolarization changes.  I personally reviewed this EKG.  ASSESSMENT AND PLAN:   H/O aortic valve replacement Tony [name] was referred to me by Dr. 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  He had bioprosthetic AVR in [place] 4/15.  Apparently he underwent cardiac cath prior to that revealing normal coronary arteries.  He has had increasing dyspnea on exertion and chest pressure.  2D echo performed by Dr. Annie Main revealed normal EF with a increase in his gradient from 20 to 40 mmHg.  He was referred for TEE, right left heart cath.  I have  reviewed the risks, indications, and alternatives to cardiac catheterization, possible angioplasty, and stenting with the patient. Risks include but are not limited to bleeding, infection, vascular injury, stroke, myocardial infection, arrhythmia, kidney injury, radiation-related injury in the case of prolonged fluoroscopy use, emergency cardiac surgery, and death. The patient understands the risks of serious complication is 1-2 in 123XX123 with diagnostic cardiac cath and 1-2% or less with angioplasty/stenting.       Lorretta Harp MD FACP,FACC,FAHA, [facility] 01/19/2019 2:14 PM     For TEE; no changes Kirk Ruths

## 2019-01-29 NOTE — Procedures (Signed)
    Transesophageal Echocardiogram Note  Tony [name] [id number] 1945-06-29  Procedure: Transesophageal Echocardiogram Indications: Prosthetic aortic valve stenosis  Procedure Details Consent: Obtained Time Out: Verified patient identification, verified procedure, site/side was marked, verified correct patient position, special equipment/implants available, Radiology Safety Procedures followed,  medications/allergies/relevent history reviewed, required imaging and test results available.  Performed  Medications:  During this procedure the patient is administered a total of Versed 6 mg and Fentanyl 50 mcg  to achieve and maintain moderate conscious sedation.  The patient's heart rate, blood pressure, and oxygen saturation are monitored continuously during the procedure. The period of conscious sedation is 30 minutes, of which I was present face-to-face 100% of this time.  Normal LV function; LVH; moderate LAE; s/p AVR with severe AS (mean gradient 51 mmHg); moderate AI; moderate MR; trace TR.   Complications: No apparent complications Patient did tolerate procedure well.  Kirk Ruths, MD

## 2019-01-29 NOTE — Discharge Instructions (Signed)
NO METFORMIN/ GLUCOPHAGE FOR 2 DAYS   Radial Site Care  This sheet gives you information about how to care for yourself after your procedure. Your health care provider may also give you more specific instructions. If you have problems or questions, contact your health care provider. What can I expect after the procedure? After the procedure, it is common to have:  Bruising and tenderness at the catheter insertion area. Follow these instructions at home: Medicines  Take over-the-counter and prescription medicines only as told by your health care provider. Insertion site care  Follow instructions from your health care provider about how to take care of your insertion site. Make sure you: ? Wash your hands with soap and water before you change your bandage (dressing). If soap and water are not available, use hand sanitizer. ? Change your dressing as told by your health care provider. ? Leave stitches (sutures), skin glue, or adhesive strips in place. These skin closures may need to stay in place for 2 weeks or longer. If adhesive strip edges start to loosen and curl up, you may trim the loose edges. Do not remove adhesive strips completely unless your health care provider tells you to do that.  Check your insertion site every day for signs of infection. Check for: ? Redness, swelling, or pain. ? Fluid or blood. ? Pus or a bad smell. ? Warmth.  Do not take baths, swim, or use a hot tub until your health care provider approves.  You may shower 24-48 hours after the procedure, or as directed by your health care provider. ? Remove the dressing and gently wash the site with plain soap and water. ? Pat the area dry with a clean towel. ? Do not rub the site. That could cause bleeding.  Do not apply powder or lotion to the site. Activity   For 24 hours after the procedure, or as directed by your health care provider: ? Do not flex or bend the affected arm. ? Do not push or pull heavy  objects with the affected arm. ? Do not drive yourself home from the hospital or clinic. You may drive 24 hours after the procedure unless your health care provider tells you not to. ? Do not operate machinery or power tools.  Do not lift anything that is heavier than 10 lb (4.5 kg), or the limit that you are told, until your health care provider says that it is safe.  Ask your health care provider when it is okay to: ? Return to work or school. ? Resume usual physical activities or sports. ? Resume sexual activity. General instructions  If the catheter site starts to bleed, raise your arm and put firm pressure on the site. If the bleeding does not stop, get help right away. This is a medical emergency.  If you went home on the same day as your procedure, a responsible adult should be with you for the first 24 hours after you arrive home.  Keep all follow-up visits as told by your health care provider. This is important. Contact a health care provider if:  You have a fever.  You have redness, swelling, or yellow drainage around your insertion site. Get help right away if:  You have unusual pain at the radial site.  The catheter insertion area swells very fast.  The insertion area is bleeding, and the bleeding does not stop when you hold steady pressure on the area.  Your arm or hand becomes pale, cool, tingly, or   numb. These symptoms may represent a serious problem that is an emergency. Do not wait to see if the symptoms will go away. Get medical help right away. Call your local emergency services (911 in the [place] Do not drive yourself to the hospital. Summary  After the procedure, it is common to have bruising and tenderness at the site.  Follow instructions from your health care provider about how to take care of your radial site wound. Check the wound every day for signs of infection.  Do not lift anything that is heavier than 10 lb (4.5 kg), or the limit that you are told,  until your health care provider says that it is safe. This information is not intended to replace advice given to you by your health care provider. Make sure you discuss any questions you have with your health care provider. Document Released: 03/17/2010 Document Revised: 03/20/2017 Document Reviewed: 03/20/2017 Elsevier Patient Education  2020 [employer]  

## 2019-01-29 NOTE — Interval H&P Note (Signed)
History and Physical Interval Note:  01/29/2019 7:20 AM  Tony [name]  has presented today for surgery, with the diagnosis of prosthetic aortic stenosis.  The various methods of treatment have been discussed with the patient and family. After consideration of risks, benefits and other options for treatment, the patient has consented to  Procedure(s): TRANSESOPHAGEAL ECHOCARDIOGRAM (TEE) (N/A) as a surgical intervention.  The patient's history has been reviewed, patient examined, no change in status, stable for surgery.  I have reviewed the patient's chart and labs.  Questions were answered to the patient's satisfaction.     Kirk Ruths

## 2019-01-29 NOTE — Interval H&P Note (Signed)
Cath Lab Visit (complete for each Cath Lab visit)  Clinical Evaluation Leading to the Procedure:   ACS: No.  Non-ACS:    Anginal Classification: CCS II  Anti-ischemic medical therapy: Minimal Therapy (1 class of medications)  Non-Invasive Test Results: No non-invasive testing performed  Prior CABG: No previous CABG      History and Physical Interval Note:  01/29/2019 3:38 PM  Tony [name]  has presented today for surgery, with the diagnosis of arotic stenosis.  The various methods of treatment have been discussed with the patient and family. After consideration of risks, benefits and other options for treatment, the patient has consented to  Procedure(s): RIGHT/LEFT HEART CATH AND CORONARY ANGIOGRAPHY (N/A) as a surgical intervention.  The patient's history has been reviewed, patient examined, no change in status, stable for surgery.  I have reviewed the patient's chart and labs.  Questions were answered to the patient's satisfaction.     Quay Burow

## 2019-01-29 NOTE — H&P (View-Only) (Signed)
    Transesophageal Echocardiogram Note  ABDULKADIR MCAULAY [id number] Jun 28, 1945  Procedure: Transesophageal Echocardiogram Indications: Prosthetic aortic valve stenosis  Procedure Details Consent: Obtained Time Out: Verified patient identification, verified procedure, site/side was marked, verified correct patient position, special equipment/implants available, Radiology Safety Procedures followed,  medications/allergies/relevent history reviewed, required imaging and test results available.  Performed  Medications:  During this procedure the patient is administered a total of Versed 6 mg and Fentanyl 50 mcg  to achieve and maintain moderate conscious sedation.  The patient's heart rate, blood pressure, and oxygen saturation are monitored continuously during the procedure. The period of conscious sedation is 30 minutes, of which I was present face-to-face 100% of this time.  Normal LV function; LVH; moderate LAE; s/p AVR with severe AS (mean gradient 51 mmHg); moderate AI; moderate MR; trace TR.   Complications: No apparent complications Patient did tolerate procedure well.  Kirk Ruths, MD

## 2019-01-29 NOTE — Progress Notes (Addendum)
Echocardiogram Echocardiogram Transesophageal has been performed.  Oneal Deputy Ahlia Lemanski 01/29/2019, 8:20 AM

## 2019-01-30 ENCOUNTER — Encounter (HOSPITAL_COMMUNITY): Payer: Self-pay | Admitting: Cardiovascular Disease

## 2019-02-05 ENCOUNTER — Ambulatory Visit (INDEPENDENT_AMBULATORY_CARE_PROVIDER_SITE_OTHER): Payer: Medicare Other | Admitting: Cardiovascular Disease

## 2019-02-05 ENCOUNTER — Other Ambulatory Visit: Payer: Self-pay

## 2019-02-05 ENCOUNTER — Encounter: Payer: Self-pay | Admitting: Cardiovascular Disease

## 2019-02-05 DIAGNOSIS — Z952 Presence of prosthetic heart valve: Secondary | ICD-10-CM | POA: Diagnosis not present

## 2019-02-05 NOTE — Assessment & Plan Note (Signed)
Tony [name] returns today for his first postop visit since his TEE and right left heart cath performed because of prosthetic aortic stenosis.  His TEE showed severe AS and moderate AI with normal LV function.  His cath showed normal coronary arteries.  I did discuss his case with Dr. Burt Knack who felt that he may be a candidate for valve in valve TAVR.  We are in the process of getting his op note from [place] where he had his valve replaced in [place] 6 years ago.  I will see him back in 3 months for follow-up.

## 2019-02-05 NOTE — Patient Instructions (Signed)
Medication Instructions:  Your physician recommends that you continue on your current medications as directed. Please refer to the Current Medication list given to you today.  If you need a refill on your cardiac medications before your next appointment, please call your pharmacy.   Lab work: NONE  Testing/Procedures: NONE  Follow-Up: At [facility], you and your health needs are our priority.  As part of our continuing mission to provide you with exceptional heart care, we have created designated Provider Care Teams.  These Care Teams include your primary Cardiologist (physician) and Advanced Practice Providers (APPs -  Physician Assistants and Nurse Practitioners) who all work together to provide you with the care you need, when you need it. You may see Dr Gwenlyn Found or one of the following Advanced Practice Providers on your designated Care Team:    Kerin Ransom, PA-C  [place], [place]  Coletta Memos, [place] Your physician wants you to follow-up in: 3 months with Dr Gwenlyn Found

## 2019-02-05 NOTE — Progress Notes (Signed)
02/05/2019 Tony [name]   [birth year]  [id number]  Primary Physician Windell Hummingbird, PA-C Primary Cardiologist: Lorretta Harp MD Garret Reddish, [place], [place]  HPI:  Tony [name] is a 73 y.o.  . married Caucasian male father of 2 children, grandfather of 69 grandchildren referred by Dr. Claudie Leach for TEE, right left heart cath because of progressive prosthetic aortic valves stenosis and associated symptoms of dyspnea and chest pressure.  I last saw him in the office 01/19/2019. 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  Mr. Factors include tobacco abuse currently smoking 2 cigars a week previously up to 8-10.  He has treated hypertension, diabetes and hyperlipidemia.  He is never had a heart attack or stroke.  His brother did have bicuspid aortic stenosis and AVR at age 93.  2D echo performed by Dr. Claudie Leach revealed preserved LV function with a worsening aortic valve gradient.  He underwent transesophageal echo by Dr. Stanford Breed revealing normal LV function, severe AS and moderate AI.  Right left heart cath performed by myself the same day showed clean coronary arteries.  I did discuss his case with Dr. Burt Knack who felt he may be a candidate for valve in valve TAVR.  We are in the process of getting his op note from [place].  He has appointment to see Dr. Burt Knack in early January.   Current Meds  Medication Sig  . acidophilus (RISAQUAD) CAPS capsule Take 1 capsule by mouth daily.  Marland Kitchen aspirin EC 81 MG tablet Take 81 mg by mouth daily.   . Coenzyme Q10 100 MG capsule Take 100 mg by mouth daily.   . Cyanocobalamin (B-12) 2500 MCG TABS Take 2,500 mcg by mouth daily.  Marland Kitchen escitalopram (LEXAPRO) 20 MG tablet Take 20 mg by mouth daily.   . Evolocumab (REPATHA SURECLICK) XX123456 MG/ML SOAJ Inject 140 mg into the skin every 14 (fourteen) days.   . hydrochlorothiazide (HYDRODIURIL) 12.5 MG tablet Take 12.5 mg by mouth daily.   . insulin glargine  (LANTUS) 100 UNIT/ML injection Inject 60 Units into the skin daily.   Marland Kitchen loperamide (IMODIUM A-D) 2 MG tablet Take 2 mg by mouth as needed for diarrhea or loose stools.  . metFORMIN (GLUCOPHAGE) 1000 MG tablet Take 1,000 mg by mouth 2 (two) times daily with a meal.   . metoprolol succinate (TOPROL XL) 50 MG 24 hr tablet Take 50 mg by mouth daily.   . Omega-3 Fatty Acids (FISH OIL) 1000 MG CAPS Take 1,000 mg by mouth daily.  Marland Kitchen omeprazole (PRILOSEC) 20 MG capsule Take 20 mg by mouth daily.  . primidone (MYSOLINE) 50 MG tablet Take 25 mg by mouth 2 (two) times daily.   Current Facility-Administered Medications for the 02/05/19 encounter (Office Visit) with Lorretta Harp, MD  Medication  . sodium chloride flush (NS) 0.9 % injection 3 mL     Allergies  Allergen Reactions  . Codeine Other (See Comments) and Rash    Hallucinations     Social History   Socioeconomic History  . Marital status: Married    Spouse name: Not on file  . Number of children: Not on file  . Years of education: Not on file  . Highest education level: Not on file  Occupational History  . Not on file  Tobacco Use  . Smoking status: Light Tobacco Smoker  . Smokeless tobacco: Never Used  . Tobacco comment: cigars 2 a week  Substance and Sexual Activity  . Alcohol use: Not on file  . Drug use: Not on file  . Sexual activity: Not on file  Other Topics Concern  . Not on file  Social History Narrative  . Not on file   Social Determinants of Health   Financial Resource Strain:   . Difficulty of Paying Living Expenses: Not on file  Food Insecurity:   . Worried About Charity fundraiser in the Last Year: Not on file  . Ran Out of Food in the Last Year: Not on file  Transportation Needs:   . Lack of Transportation (Medical): Not on file  . Lack of Transportation (Non-Medical): Not on file  Physical Activity:   . Days of Exercise per Week: Not on file  . Minutes of Exercise per Session: Not on file    Stress:   . Feeling of Stress : Not on file  Social Connections:   . Frequency of Communication with Friends and Family: Not on file  . Frequency of Social Gatherings with Friends and Family: Not on file  . Attends Religious Services: Not on file  . Active Member of Clubs or Organizations: Not on file  . Attends Archivist Meetings: Not on file  . Marital Status: Not on file  Intimate Partner Violence:   . Fear of Current or Ex-Partner: Not on file  . Emotionally Abused: Not on file  . Physically Abused: Not on file  . Sexually Abused: Not on file     Review of Systems: General: negative for chills, fever, night sweats or weight changes.  Cardiovascular: negative for chest pain, dyspnea on exertion, edema, orthopnea, palpitations, paroxysmal nocturnal dyspnea or shortness of breath Dermatological: negative for rash Respiratory: negative for cough or wheezing Urologic: negative for hematuria Abdominal: negative for nausea, vomiting, diarrhea, bright red blood per rectum, melena, or hematemesis Neurologic: negative for visual changes, syncope, or dizziness All other systems reviewed and are otherwise negative except as noted above.    Blood pressure (!) 141/67, pulse 85, height 5\' 10"  (1.778 m), weight 160 lb (72.6 kg).  General appearance: alert and no distress Neck: no adenopathy, no carotid bruit, no JVD, supple, symmetrical, trachea midline and thyroid not enlarged, symmetric, no tenderness/mass/nodules Lungs: clear to auscultation bilaterally Heart: 2/6 systolic ejection murmur at the base consistent with aortic stenosis Extremities: extremities normal, atraumatic, no cyanosis or edema Pulses: 2+ and symmetric Skin: Skin color, texture, turgor normal. No rashes or lesions Neurologic: Alert and oriented X 3, normal strength and tone. Normal symmetric reflexes. Normal coordination and gait  EKG not performed today  ASSESSMENT AND PLAN:   H/O aortic valve  replacement [name] returns today for his first postop visit since his TEE and right left heart cath performed because of prosthetic aortic stenosis.  His TEE showed severe AS and moderate AI with normal LV function.  His cath showed normal coronary arteries.  I did discuss his case with Dr. Burt Knack who felt that he may be a candidate for valve in valve TAVR.  We are in the process of getting his op note from [place] where he had his valve replaced in [place] 6 years ago.  I will see him back in 3 months for follow-up.      Lorretta Harp MD FACP,FACC,FAHA, [facility] 02/05/2019 3:59 PM

## 2019-02-10 ENCOUNTER — Telehealth: Payer: Self-pay

## 2019-02-10 NOTE — Telephone Encounter (Signed)
Spoke to wife. Received date from procedure.

## 2019-02-10 NOTE — Telephone Encounter (Signed)
FYI  Pt did not have TAVR, he is currently undergoing evaluation for TAVR.  The pt underwent surgical AVR in [facility] and this is the operative note needed. Date of surgery for AVR was June 05, 2013.

## 2019-02-10 NOTE — Telephone Encounter (Signed)
LVM for pt regarding medical records from [facility]. Need date of TAVR procedure to get operative note faxed to [facility] pt to call back.

## 2019-03-13 ENCOUNTER — Other Ambulatory Visit: Payer: Self-pay

## 2019-03-13 ENCOUNTER — Encounter: Payer: Self-pay | Admitting: Cardiovascular Disease

## 2019-03-13 ENCOUNTER — Ambulatory Visit (INDEPENDENT_AMBULATORY_CARE_PROVIDER_SITE_OTHER): Payer: Medicare Other | Admitting: Cardiovascular Disease

## 2019-03-13 VITALS — BP 120/58 | HR 86 | Ht 70.0 in | Wt 162.0 lb

## 2019-03-13 DIAGNOSIS — I350 Nonrheumatic aortic (valve) stenosis: Secondary | ICD-10-CM | POA: Diagnosis not present

## 2019-03-13 NOTE — Progress Notes (Signed)
Cardiology Office Note:    Date:  03/13/2019   ID:  Tony [name], DOB 10-Dec-1945, MRN [id number]  PCP:  Windell Hummingbird, PA-C  Cardiologist:  No primary care provider on file.  Electrophysiologist:  None   Referring MD: Windell Hummingbird, PA-C   Chief Complaint  Patient presents with   Shortness of Breath    History of Present Illness:    Tony [name] is a 74 y.o. male referred by Dr. Gwenlyn [name] for evaluation of severe aortic stenosis.  The patient developed symptomatic bicuspid aortic stenosis and underwent surgical AVR in 2015 at [facility] in [place], [place]. His valve was replaced with a 23 mm St Jude Trifecta bovine pericardial tissue valve. He did not have significant CAD at the time. He describes his post-operative course as uneventful except he reports that the valve developed a 'leak' within 30 days of surgery. He feels like he never really got his energy back after surgery. However, he was not having specific symptoms until the past month when he has developed exertional dyspnea, dizziness, and even chest discomfort with activity.  Transesophageal echo demonstrated structural deterioration of the aortic bioprosthesis with severe stenosis, mean gradient 51 mmHg, moderate AI, and evidence of an echolucent space with flow communicating to the aortic valve possibly a healed abscess. In 2019 it is notable that the patient had Enterococcus faecalis bacteremia.  He was treated with 6 weeks of ampicillin and ceftriaxone.  He underwent transesophageal echo evaluation and this was reportedly negative for any findings consistent with endocarditis.  He has had no further infectious symptoms.  He is here with his wife today.  His current symptoms include progressive fatigue, frequent naps, shortness of breath with activity with New York Heart Association functional class II symptoms of chronic diastolic heart failure, and occasional episodes of exertional angina.  He denies orthopnea, PND, or syncope.   He reports a degree of gait instability.  Past Medical History:  Diagnosis Date   Diabetes mellitus without complication ([place])    H/O aortic valve replacement 01/19/2019   Aortic valve replacement    Past Surgical History:  Procedure Laterality Date   RIGHT/LEFT HEART CATH AND CORONARY ANGIOGRAPHY N/A 01/29/2019   Procedure: RIGHT/LEFT HEART CATH AND CORONARY ANGIOGRAPHY;  Surgeon: Lorretta Harp, MD;  Location: [facility];  Service: Cardiovascular;  Laterality: N/A;   TEE WITHOUT CARDIOVERSION N/A 01/29/2019   Procedure: TRANSESOPHAGEAL ECHOCARDIOGRAM (TEE);  Surgeon: Lelon Perla, MD;  Location: [facility] ENDOSCOPY;  Service: Cardiovascular;  Laterality: N/A;    Current Medications: Current Meds  Medication Sig   acidophilus (RISAQUAD) CAPS capsule Take 1 capsule by mouth daily.   aspirin EC 81 MG tablet Take 81 mg by mouth daily.    Coenzyme Q10 100 MG capsule Take 100 mg by mouth daily.    Cyanocobalamin (B-12) 2500 MCG TABS Take 2,500 mcg by mouth daily.   escitalopram (LEXAPRO) 20 MG tablet Take 20 mg by mouth daily.    Evolocumab (REPATHA SURECLICK) [id number] MG/ML SOAJ Inject 140 mg into the skin every 14 (fourteen) days.    hydrochlorothiazide (HYDRODIURIL) 12.5 MG tablet Take 12.5 mg by mouth daily.    insulin glargine (LANTUS) 100 UNIT/ML injection Inject 70 Units into the skin daily.    loperamide (IMODIUM A-D) 2 MG tablet Take 2 mg by mouth as needed for diarrhea or loose stools.   metFORMIN (GLUCOPHAGE) 1000 MG tablet Take 1,000 mg by mouth 2 (two) times daily with a meal.  metoprolol succinate (TOPROL XL) 50 MG 24 hr tablet Take 50 mg by mouth daily.    Omega-3 Fatty Acids (FISH OIL) 1000 MG CAPS Take 1,000 mg by mouth daily.   omeprazole (PRILOSEC) 20 MG capsule Take 20 mg by mouth daily.   primidone (MYSOLINE) 50 MG tablet Take 25 mg by mouth 2 (two) times daily.   testosterone cypionate (DEPOTESTOSTERONE CYPIONATE) 200 MG/ML injection Inject  200 mg into the muscle every 14 (fourteen) days.   Current Facility-Administered Medications for the 03/13/19 encounter (Office Visit) with Sherren Mocha, MD  Medication   sodium chloride flush (NS) 0.9 % injection 3 mL     Allergies:   Codeine   Social History   Socioeconomic History   Marital status: Married    Spouse name: Not on file   Number of children: Not on file   Years of education: Not on file   Highest education level: Not on file  Occupational History   Not on file  Tobacco Use   Smoking status: Light Tobacco Smoker   Smokeless tobacco: Never Used   Tobacco comment: cigars 2 a week   Substance and Sexual Activity   Alcohol use: Not on file   Drug use: Not on file   Sexual activity: Not on file  Other Topics Concern   Not on file  Social History Narrative   Not on file   Social Determinants of Health   Financial Resource Strain:    Difficulty of Paying Living Expenses: Not on file  Food Insecurity:    Worried About [place] in the Last Year: Not on file   Ran Out of Food in the Last Year: Not on file  Transportation Needs:    Lack of Transportation (Medical): Not on file   Lack of Transportation (Non-Medical): Not on file  Physical Activity:    Days of Exercise per Week: Not on file   Minutes of Exercise per Session: Not on file  Stress:    Feeling of Stress : Not on file  Social Connections:    Frequency of Communication with Friends and Family: Not on file   Frequency of Social Gatherings with Friends and Family: Not on file   Attends Religious Services: Not on file   Active Member of Clubs or Organizations: Not on file   Attends Archivist Meetings: Not on file   Marital Status: Not on file     Family History: The patient's family history is pertinent for three siblings with bicuspid aortic valves  ROS:   Please see the history of present illness.    All other systems reviewed and are  negative.  EKGs/Labs/Other Studies Reviewed:    The following studies were reviewed today: TEE: IMPRESSIONS    1. Left ventricular ejection fraction, by visual estimation, is 55 to 60%. The left ventricle has normal function. There is mildly increased left ventricular hypertrophy.  2. Global right ventricle has normal systolic function.The right ventricular size is normal.  3. Left atrial size was moderately dilated.  4. Right atrial size was normal.  5. The mitral valve is grossly normal. Moderate mitral valve regurgitation.  6. The tricuspid valve is normal in structure. Tricuspid valve regurgitation is trivial.  7. Aortic valve regurgitation is moderate. Severe aortic valve stenosis.  8. The pulmonic valve was not well visualized. Pulmonic valve regurgitation is not visualized.  9. Moderate plaque invoving the descending aorta. 10. Normal LV function; s/p AVR with severe AS (mean gradient  52 mmg) and moderate AI; there is an echolucent space contiguous to prosthetic aortic valve with flow; ? prior abscess cavity; moderate MR; trace TR.  FINDINGS  Left Ventricle: Left ventricular ejection fraction, by visual estimation, is 55 to 60%. The left ventricle has normal function. There is mildly increased left ventricular hypertrophy.  Right Ventricle: The right ventricular size is normal. Global RV systolic function is has normal systolic function.  Left Atrium: Left atrial size was moderately dilated.  Right Atrium: Right atrial size was normal in size  Pericardium: There is no evidence of pericardial effusion.  Mitral Valve: The mitral valve is grossly normal. Moderate mitral valve regurgitation.  Tricuspid Valve: The tricuspid valve is normal in structure. Tricuspid valve regurgitation is trivial.  Aortic Valve: The aortic valve has been repaired/replaced. Aortic valve regurgitation is moderate. Severe aortic stenosis is present. Aortic valve mean gradient measures 51.0  mmHg. Aortic valve peak gradient measures 78.1 mmHg.  Pulmonic Valve: The pulmonic valve was not well visualized. Pulmonic valve regurgitation is not visualized.  Aorta: The aortic root is normal in size and structure. There is moderate plaque involving the descending aorta.  Shunts: No atrial level shunt detected by color flow Doppler.    AORTIC VALVE               Normals AV Vmax:      442.00 cm/s AV Vmean:     342.000 cm/s 77 cm/s AV VTI:       1.110 m      3.15 cm2 AV Peak Grad: 78.1 mmHg AV Mean Grad: 51.0 mmHg    3 mmHg  Cardiac Cath 01-29-2019: CARDIAC CATHETERIZATION     History obtained from chart review.Lachlan Speir Hutchensis a 74 y.o.married Caucasian male father of 2 children, grandfather of 33 grandchildren referred by Dr. Claudie Leach for TEE, right left heart cath because of progressive prosthetic aortic valves stenosis and associated symptoms of dyspnea and chest pressure. 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 Mr. Factors include tobacco abuse currently smoking 2 cigars a week previously up to 8-10. He has treated hypertension, diabetes and hyperlipidemia. He is never had a heart attack or stroke. His brother did have bicuspid aortic stenosis and AVR at age 95. 2D echo performed by Dr. Claudie Leach revealed preserved LV function with a worsening aortic valve gradient.  He presents today for transesophageal echo performed this morning by Dr. Stanford Breed, right and left heart cath to define his anatomy and physiology.   IMPRESSION: Mr. Lacinda Axon has preserved LV function, severe aortic stenosis and a prosthetic valve with moderate aortic insufficiency, and normal coronary arteries with a left dominant system.  He will need aortic valve replacement.  It is unclear whether this can be done using TAVR.  The sheath was removed and a TR band was placed on the right wrist to achieve patent hemostasis.  The patient left the lab in  stable condition.  He will be discharged home later today as an outpatient and will follow up with me next week.  I discussed the results with the referring cardiologist, Dr. Claudie Leach.  Quay Burow. MD, [facility] 01/29/2019 4:29 PM      Recommendations  Antiplatelet/Anticoag Recommend Aspirin 81mg  daily for moderate CAD.  Indications  Severe aortic stenosis [I35.0 (ICD-10-CM)]  Procedural Details  Technical Details PROCEDURE DESCRIPTION:   The patient was brought to the second floor [place] Cardiac cath lab in the postabsorptive state. He  was premedicated with IV Versed and fentanyl. His right wrist and antecubital fossaWere prepped and shaved in usual sterile fashion. Xylocaine 1% was used for local anesthesia. A 6 French sheath was inserted into the right radial artery using standard Seldinger technique. The patient received 3500 units  of heparin intravenously.  A 5 French sheath was inserted into the right antecubital vein.  A 5 French balloontipped Swan-Ganz catheter was advanced through the right heart chambers obtaining sequential pressures and blood samples for the determination of Fick cardiac output.  A 5 [place] TIG catheter was used for coronary angiography.  On the pigtail was used for the entirety of the case.  Retroaortic pressures monitored in the case.  The patient received radial cocktail via the SideArm sheath.  Estimated blood loss <50 mL.   During this procedure medications were administered to achieve and maintain moderate conscious sedation while the patient's heart rate, blood pressure, and oxygen saturation were continuously monitored and I was present face-to-face 100% of this time.  Medications (Filter: Administrations occurring from 01/29/19 1525 to 01/29/19 1625) (important)  Continuous medications are totaled by the amount administered until 01/29/19 1625.  midazolam (VERSED) injection (mg) Total dose:  0.5 mg  Date/Time  Rate/Dose/Volume Action    01/29/19 1539  0.5 mg Given    fentaNYL (SUBLIMAZE) injection (mcg) Total dose:  25 mcg  Date/Time  Rate/Dose/Volume Action  01/29/19 1539  25 mcg Given    lidocaine (PF) (XYLOCAINE) 1 % injection (mL) Total volume:  4 mL  Date/Time  Rate/Dose/Volume Action  01/29/19 1552  2 mL Given  1558  2 mL Given    Radial Cocktail/Verapamil only (mL) Total volume:  0 mL  Date/Time  Rate/Dose/Volume Action  01/29/19   Canceled Entry    Radial Cocktail (Verapamil 2.5 mg, NTG, Lidocaine) (mL) Total volume:  5 mL  Date/Time  Rate/Dose/Volume Action  01/29/19 1600  5 mL Given    heparin injection (Units) Total dose:  3,500 Units  Date/Time  Rate/Dose/Volume Action  01/29/19 1601  3,500 Units Given    iohexol (OMNIPAQUE) 350 MG/ML injection (mL) Total volume:  55 mL  Date/Time  Rate/Dose/Volume Action  01/29/19 1611  55 mL Given    Heparin (Porcine) in NaCl 1000-0.9 UT/500ML-% SOLN (mL) Total volume:  1,500 mL  Date/Time  Rate/Dose/Volume Action  01/29/19 1611  500 mL Given  1611  500 mL Given  1611  500 mL Given    Sedation Time  Sedation Time Physician-1: 27 minutes 30 seconds  Contrast  Medication Name Total Dose  iohexol (OMNIPAQUE) 350 MG/ML injection 55 mL    Radiation/Fluoro  Fluoro time: 5.4 (min) DAP: 15302 (mGycm2) Cumulative Air Kerma: 226 (mGy)  Coronary Findings  Diagnostic Dominance: Left No diagnostic findings have been documented. Intervention  No interventions have been documented. Right Heart  Right Heart Pressures Hemodynamic findings consistent with aortic valve stenosis. 1: Right atrial pressure: 4/4 2: Right ventricular pressure-37/4 3: Pulmonary artery pressure-39/24, mean 31 4: Pulmonary wedge pressure-17/17, mean 17 Cardiac output-4.2 L/min with an index of 2.2 L/min/m  Coronary Diagrams  Diagnostic Dominance: Left    EKG:  EKG is not ordered today.   Recent Labs: 01/19/2019: Platelets 172 01/29/2019: BUN 12; Creatinine,  Ser 0.60; Hemoglobin 12.9; Potassium 3.9; Sodium 130  Recent Lipid Panel No results [name] for: CHOL, TRIG, HDL, CHOLHDL, VLDL, LDLCALC, LDLDIRECT  Physical Exam:    VS:  BP (!) 120/58    Pulse 86    Ht 5\' 10"  (  1.778 m)    Wt 162 lb (73.5 kg)    SpO2 98%    BMI 23.24 kg/m     Wt Readings from Last 3 Encounters:  03/13/19 162 lb (73.5 kg)  02/05/19 160 lb (72.6 kg)  01/19/19 160 lb 9.6 oz (72.8 kg)     GEN:  Well nourished, well developed in no acute distress HEENT: Normal NECK: No JVD; BL carotid bruits LYMPHATICS: No lymphadenopathy CARDIAC: RRR, 3/6 systolic murmur at the RUSB RESPIRATORY:  Clear to auscultation without rales, wheezing or rhonchi  ABDOMEN: Soft, non-tender, non-distended MUSCULOSKELETAL:  No edema; No deformity  SKIN: Warm and dry NEUROLOGIC:  Alert and oriented x 3 PSYCHIATRIC:  Normal affect   STS Risk Calculator (Isolated redo AVR): Risk of Mortality: 1.749% Renal Failure: 1.124% Permanent Stroke: 1.459% Prolonged Ventilation: 5.151% DSW Infection: 0.130% Reoperation: 4.804% Morbidity or Mortality: 10.254% Short Length of Stay: 48.453% Long Length of Stay: 4.110%  ASSESSMENT:    1. Severe aortic stenosis    PLAN:    In order of problems listed above:  The patient has severe, stage D1 bioprosthetic aortic valve stenosis and insufficiency.  He has New York Heart Association functional class IIb symptoms.  I have personally reviewed his echo and cardiac catheterization data.  Cardiac catheterization demonstrates no evidence of coronary artery disease.  Right heart catheterization hemodynamics are essentially normal.  TEE shows normal LV function, mild to moderate functional mitral regurgitation, and severe thickening of the patient's bioprosthetic aortic valve leaflets with at least moderate aortic insufficiency.  The aortic insufficiency jet is eccentric and difficult to determine whether there may be a paravalvular component.  After review of  the study, I am fairly convinced there is an intravalvular leak.  There is flow seen into an echolucent space in the aortic sinus and this could potentially represent an area of prior abscess.  I plan to review his TEE images with our multidisciplinary heart valve team including structural imaging specialists.  I have reviewed the natural history of aortic stenosis with the patient and their family members  who are present today. We have discussed the limitations of medical therapy and the poor prognosis associated with symptomatic aortic stenosis. We have reviewed potential treatment options, including palliative medical therapy, conventional surgical aortic valve replacement, and transcatheter aortic valve replacement. We discussed treatment options in the context of the patient's specific comorbid medical conditions.  While the patient is hopeful he can be treated without repeat sternotomy, he understands that we need further information regarding anatomic suitability for TAVR.  He will undergo a gated cardiac CTA study as well as a CTA of the chest, abdomen, and pelvis to assess for anatomic suitability of TAVR.  Pending CT data, he could potentially be a candidate for treatment with either a 23 mm Edwards SAPIEN 3 valve or a 26 mm Medtronic evolut valve.  After his CT scans are done and his case is reviewed, he will be referred for formal cardiac surgical consultation as part of a multidisciplinary approach to his care.  In the meantime he is advised to avoid strenuous activity.  He will continue on his current medical program.   Medication Adjustments/Labs and Tests Ordered: Current medicines are reviewed at length with the patient today.  Concerns regarding medicines are outlined above.  Orders Placed This Encounter  Procedures   Basic Metabolic Panel (BMET)   No orders of the defined types were placed in this encounter.   Patient Instructions  Your cardiac CT  will be scheduled at:  [facility] [address] [place], [place] [address] [phone]  Please arrive at the [facility] main entrance of [facility] 30-45 minutes prior to test start time. Proceed to the [facility] Radiology Department (first floor) to check-in and test prep.  Please follow these instructions carefully:  Hold all erectile dysfunction medications at least 3 days (72 hrs) prior to test.  On the Night Before the Test:  Be sure to Drink plenty of water.  Do not consume any caffeinated/decaffeinated beverages or chocolate 12 hours prior to your test.  Do not take any antihistamines 12 hours prior to your test.  On the Day of the Test:  Drink plenty of water. Do not drink any water within one hour of the test.  Do not eat any food 4 hours prior to the test.  You may take your regular medications the morning prior to the test EXCEPT: Metformin, HCTZ  Take TOPROL 100 mg two hours prior to test instead of your regular 50 mg.       After the Test:  Drink plenty of water.  After receiving IV contrast, you may experience a mild flushed feeling. This is normal.  On occasion, you may experience a mild rash up to 24 hours after the test. This is not dangerous. If this occurs, you can take Benadryl 25 mg and increase your fluid intake.  If you experience trouble breathing, this can be serious. If it is severe call 911 IMMEDIATELY. If it is mild, please call our office.  DO NOT TAKE METFORMIN for 48 hours after your test.    Once we have confirmed authorization from your insurance company, we will call you to set up a date and time for your test.   For non-scheduling related questions, please contact the cardiac imaging nurse navigator should you have any questions/concerns: Marchia Bond, RN Navigator Cardiac Imaging [facility] Heart and Vascular Services [phone] Office       Signed, Sherren Mocha, MD  03/13/2019 4:50 PM    [place]

## 2019-03-13 NOTE — Patient Instructions (Addendum)
Your cardiac CT will be scheduled at:  [facility] [address] [place], [place] [address] [phone]  Please arrive at the [facility] main entrance of [facility] 30-45 minutes prior to test start time. Proceed to the [facility] Radiology Department (first floor) to check-in and test prep.  Please follow these instructions carefully:  Hold all erectile dysfunction medications at least 3 days (72 hrs) prior to test.  On the Night Before the Test: . Be sure to Drink plenty of water. . Do not consume any caffeinated/decaffeinated beverages or chocolate 12 hours prior to your test. . Do not take any antihistamines 12 hours prior to your test.  On the Day of the Test: . Drink plenty of water. Do not drink any water within one hour of the test. . Do not eat any food 4 hours prior to the test. . You may take your regular medications the morning prior to the test EXCEPT: Metformin, HCTZ . Take TOPROL 100 mg two hours prior to test instead of your regular 50 mg.       After the Test: . Drink plenty of water. . After receiving IV contrast, you may experience a mild flushed feeling. This is normal. . On occasion, you may experience a mild rash up to 24 hours after the test. This is not dangerous. If this occurs, you can take Benadryl 25 mg and increase your fluid intake. . If you experience trouble breathing, this can be serious. If it is severe call 911 IMMEDIATELY. If it is mild, please call our office. . DO NOT TAKE METFORMIN for 48 hours after your test.    Once we have confirmed authorization from your insurance company, we will call you to set up a date and time for your test.   For non-scheduling related questions, please contact the cardiac imaging nurse navigator should you have any questions/concerns: Marchia Bond, RN Navigator Cardiac Imaging [facility] Heart and Vascular Services [phone] Office

## 2019-03-14 LAB — BASIC METABOLIC PANEL
BUN/Creatinine Ratio: 11 (ref 10–24)
BUN: 12 mg/dL (ref 8–27)
CO2: 25 mmol/L (ref 20–29)
Calcium: 9.9 mg/dL (ref 8.6–10.2)
Chloride: 97 mmol/L (ref 96–106)
Creatinine, Ser: 1.06 mg/dL (ref 0.76–1.27)
GFR calc Af Amer: 81 mL/min/{1.73_m2} (ref >59)
GFR calc non Af Amer: 70 mL/min/{1.73_m2} (ref >59)
Glucose: 150 mg/dL — ABNORMAL HIGH (ref 65–99)
Potassium: 4.3 mmol/L (ref 3.5–5.2)
Sodium: 137 mmol/L (ref 134–144)

## 2019-03-16 ENCOUNTER — Other Ambulatory Visit: Payer: Self-pay

## 2019-03-16 DIAGNOSIS — I350 Nonrheumatic aortic (valve) stenosis: Secondary | ICD-10-CM

## 2019-03-23 ENCOUNTER — Other Ambulatory Visit: Payer: Self-pay

## 2019-03-23 ENCOUNTER — Ambulatory Visit (HOSPITAL_COMMUNITY): Payer: Medicare Other

## 2019-03-23 ENCOUNTER — Encounter: Payer: Self-pay | Admitting: Physician Assistant

## 2019-03-23 ENCOUNTER — Ambulatory Visit (HOSPITAL_COMMUNITY)
Admission: RE | Admit: 2019-03-23 | Discharge: 2019-03-23 | Disposition: A | Discharge status: Home / Self Care | Payer: Medicare Other | Source: Ambulatory Visit | Attending: Cardiovascular Disease | Admitting: Cardiovascular Disease

## 2019-03-23 ENCOUNTER — Ambulatory Visit (HOSPITAL_BASED_OUTPATIENT_CLINIC_OR_DEPARTMENT_OTHER)
Admission: RE | Admit: 2019-03-23 | Discharge: 2019-03-23 | Disposition: A | Discharge status: Home / Self Care | Payer: Medicare Other | Source: Ambulatory Visit | Attending: Cardiovascular Disease | Admitting: Cardiovascular Disease

## 2019-03-23 DIAGNOSIS — I350 Nonrheumatic aortic (valve) stenosis: Secondary | ICD-10-CM

## 2019-03-23 MED ORDER — METOPROLOL TARTRATE 5 MG/5ML IV SOLN: 5.0000 mg | INTRAVENOUS | Status: DC | PRN

## 2019-03-23 MED ORDER — METOPROLOL TARTRATE 5 MG/5ML IV SOLN
INTRAVENOUS | Status: AC
  Administered 2019-03-23: 15:00:00 5 mg
  Filled 2019-03-23: qty 20

## 2019-03-23 MED ORDER — IOHEXOL 350 MG/ML SOLN
100.0000 mL | Freq: Once | INTRAVENOUS | Status: AC | PRN
  Administered 2019-03-23: 100 mL via INTRAVENOUS

## 2019-03-23 NOTE — Progress Notes (Signed)
Carotid duplex exam completed.  Preliminary results can be found under CV proc under chart review.  03/23/2019 4:13 PM  [place], K., RDMS, RVT

## 2019-03-25 ENCOUNTER — Other Ambulatory Visit: Payer: Self-pay

## 2019-03-25 ENCOUNTER — Ambulatory Visit: Payer: Medicare Other | Admitting: Physical Therapy

## 2019-03-25 ENCOUNTER — Institutional Professional Consult (permissible substitution) (INDEPENDENT_AMBULATORY_CARE_PROVIDER_SITE_OTHER): Payer: Medicare Other | Admitting: Surgery

## 2019-03-25 ENCOUNTER — Encounter: Payer: Self-pay | Admitting: Surgery

## 2019-03-25 ENCOUNTER — Encounter: Payer: Medicare Other | Admitting: Surgery

## 2019-03-25 VITALS — BP 145/66 | HR 102 | Temp 97.5°F | Resp 16 | Ht 70.0 in | Wt 162.0 lb

## 2019-03-25 DIAGNOSIS — Z952 Presence of prosthetic heart valve: Secondary | ICD-10-CM | POA: Diagnosis not present

## 2019-03-25 DIAGNOSIS — I350 Nonrheumatic aortic (valve) stenosis: Secondary | ICD-10-CM

## 2019-03-26 ENCOUNTER — Encounter: Payer: Self-pay | Admitting: Surgery

## 2019-03-26 NOTE — Progress Notes (Signed)
Patient ID: Tony [name], [name]   DOB: [name], 74 y.o.   MRN: [id number]  [place] SURGERY CONSULTATION REPORT  Referring Provider is Lorretta Harp, MD Primary Cardiologist is No primary care provider on file. PCP is Windell Hummingbird, PA-C  Chief Complaint  Patient presents with  . Bioprosthetic aortic valve stenosis and insufficiency        HPI:  The patient is a 74 year old gentleman with a history of bicuspid aortic valve stenosis who underwent surgical aortic valve replacement using a 23 mm St Jude Trifecta bovine pericardial valve in 2015 at [facility] in [place], [place].  He reports an uneventful postoperative course although he felt like he never completely recovered.  He said that a repeat echocardiogram within 30 days of surgery showed that the valve developed a leak.  The report from that echo showed mild to moderate aortic insufficiency with a pressure half-time of 302 ms.  They did not comment on whether the regurgitation was within the valve or perivalvular.  Patient had normal left ventricular systolic function with evidence of diastolic dysfunction.  There was mild to moderate mitral regurgitation.  Then in 2019 he was found to have Enterococcus faecalis bacteremia which she developed after repeated cystoscopy for surveillance and treatment of a bladder cancer.  He reportedly had an TEE at that time that was negative for any findings consistent with endocarditis.  He was treated with 6 weeks of ampicillin and ceftriaxone and recovered.  He said that he did not have any specific symptoms until the past 1 to 2 months when he developed exertional shortness of breath and fatigue, dizziness, and some chest discomfort with activity.  He said that he is getting short of breath walking from room to room in his house.  He denies orthopnea.  He has had some issues with balance and uses a cane.  He is here today with his  wife.  Past Medical History:  Diagnosis Date  . Diabetes mellitus without complication ([place])   . H/O aortic valve replacement 01/19/2019   Aortic valve replacement  . HTN (hypertension)     Past Surgical History:  Procedure Laterality Date  . RIGHT/LEFT HEART CATH AND CORONARY ANGIOGRAPHY N/A 01/29/2019   Procedure: RIGHT/LEFT HEART CATH AND CORONARY ANGIOGRAPHY;  Surgeon: Lorretta Harp, MD;  Location: [place] [facility];  Service: Cardiovascular;  Laterality: N/A;  . TEE WITHOUT CARDIOVERSION N/A 01/29/2019   Procedure: TRANSESOPHAGEAL ECHOCARDIOGRAM (TEE);  Surgeon: Lelon Perla, MD;  Location: [facility] ENDOSCOPY;  Service: Cardiovascular;  Laterality: N/A;    History reviewed. No pertinent family history.  Social History   Socioeconomic History  . Marital status: Married    Spouse name: Not on file  . Number of children: Not on file  . Years of education: Not on file  . Highest education level: Not on file  Occupational History  . Not on file  Tobacco Use  . Smoking status: Light Tobacco Smoker  . Smokeless tobacco: Never Used  . Tobacco comment: cigars 2 a week   Substance and Sexual Activity  . Alcohol use: Not on file  . Drug use: Not on file  . Sexual activity: Not on file  Other Topics Concern  . Not on file  Social History Narrative  . Not on file   Social Determinants of Health   Financial Resource Strain:   . Difficulty of Paying Living Expenses: Not on file  Food Insecurity:   .  Worried About Charity fundraiser in the Last Year: Not on file  . Ran Out of Food in the Last Year: Not on file  Transportation Needs:   . Lack of Transportation (Medical): Not on file  . Lack of Transportation (Non-Medical): Not on file  Physical Activity:   . Days of Exercise per Week: Not on file  . Minutes of Exercise per Session: Not on file  Stress:   . Feeling of Stress : Not on file  Social Connections:   . Frequency of Communication with Friends and Family: Not  on file  . Frequency of Social Gatherings with Friends and Family: Not on file  . Attends Religious Services: Not on file  . Active Member of Clubs or Organizations: Not on file  . Attends Archivist Meetings: Not on file  . Marital Status: Not on file  Intimate Partner Violence:   . Fear of Current or Ex-Partner: Not on file  . Emotionally Abused: Not on file  . Physically Abused: Not on file  . Sexually Abused: Not on file    Current Outpatient Medications  Medication Sig Dispense Refill  . acidophilus (RISAQUAD) CAPS capsule Take 1 capsule by mouth daily.    Marland Kitchen aspirin EC 81 MG tablet Take 81 mg by mouth daily.     . Coenzyme Q10 100 MG capsule Take 100 mg by mouth daily.     . Cyanocobalamin (B-12) 2500 MCG TABS Take 2,500 mcg by mouth daily.    Marland Kitchen escitalopram (LEXAPRO) 20 MG tablet Take 20 mg by mouth daily.     . Evolocumab (REPATHA SURECLICK) [id number] MG/ML SOAJ Inject 140 mg into the skin every 14 (fourteen) days.     . hydrochlorothiazide (HYDRODIURIL) 12.5 MG tablet Take 12.5 mg by mouth daily.     . insulin glargine (LANTUS) 100 UNIT/ML injection Inject 70 Units into the skin daily.     Marland Kitchen loperamide (IMODIUM A-D) 2 MG tablet Take 2 mg by mouth as needed for diarrhea or loose stools.    . metFORMIN (GLUCOPHAGE) 1000 MG tablet Take 1,000 mg by mouth 2 (two) times daily with a meal.     . metoprolol succinate (TOPROL XL) 50 MG 24 hr tablet Take 50 mg by mouth daily.     . Omega-3 Fatty Acids (FISH OIL) 1000 MG CAPS Take 1,000 mg by mouth daily.    Marland Kitchen omeprazole (PRILOSEC) 20 MG capsule Take 20 mg by mouth daily.    . primidone (MYSOLINE) 50 MG tablet Take 25 mg by mouth 2 (two) times daily.    Marland Kitchen testosterone cypionate (DEPOTESTOSTERONE CYPIONATE) 200 MG/ML injection Inject 200 mg into the muscle every 14 (fourteen) days.     Current Facility-Administered Medications  Medication Dose Route Frequency Provider Last Rate Last Admin  . sodium chloride flush (NS) 0.9 % injection  3 mL  3 mL Intravenous Q12H Lorretta Harp, MD        Allergies  Allergen Reactions  . Codeine Other (See Comments) and Rash    Hallucinations       Review of Systems:   General:  + decreased appetite, + decreased energy, no weight gain, + weight loss, no fever  Cardiac:  + chest pain with exertion, no chest pain at rest, +SOB with mild exertion, no resting SOB, no PND, no orthopnea, + palpitations, no arrhythmia, no atrial fibrillation, no LE edema, + dizzy spells, no syncope  Respiratory:  + exertional shortness of breath, no home  oxygen, no productive cough, no dry cough, no bronchitis, no wheezing, no hemoptysis, no asthma, no pain with inspiration or cough, + sleep apnea, no CPAP at night  GI:   no difficulty swallowing, no reflux, no frequent heartburn, no hiatal hernia, no abdominal pain, no constipation, no diarrhea, no hematochezia, no hematemesis, no melena  GU:   no dysuria,  no frequency, no urinary tract infection, no hematuria, no enlarged prostate, no kidney stones, no kidney disease  Vascular:  no pain suggestive of claudication, no pain in feet, no leg cramps, no varicose veins, no DVT, no non-healing foot ulcer  Neuro:   no stroke, no TIA's, no seizures, no headaches, no temporary blindness one eye,  no slurred speech, no peripheral neuropathy, no chronic pain, + instability of gait, no memory/cognitive dysfunction  Musculoskeletal: + arthritis, no joint swelling, no myalgias, no difficulty walking, normal mobility   Skin:   no rash, + itching, no skin infections, no pressure sores or ulcerations  Psych:   + anxiety, + depression, no nervousness, no unusual recent stress  Eyes:   no blurry vision, no floaters, no recent vision changes, + wears glasses or contacts  ENT:   no hearing loss, no loose or painful teeth, no dentures, last saw dentist 07/2018  Hematologic:  no easy bruising, no abnormal bleeding, no clotting disorder, no frequent epistaxis  Endocrine:  +  diabetes, does check CBG's at home     Physical Exam:   BP (!) 145/66 (BP Location: Right Arm, Patient Position: Sitting, Cuff Size: Normal)   Pulse (!) 102   Temp (!) 97.5 F (36.4 C)   Resp 16   Ht 5\' 10"  (1.778 m)   Wt 162 lb (73.5 kg)   SpO2 98% Comment: RA  BMI 23.24 kg/m   General:  well-appearing  HEENT:  Unremarkable, NCAT, PERLA, EOMI,   Neck:   no JVD, no bruits, no adenopathy   Chest:   clear to auscultation, symmetrical breath sounds, no wheezes, no rhonchi, old sternotomy scar  CV:   RRR, grade lll/VI crescendo/decrescendo murmur heard best at RSB, 3/6 diastolic murmur at apex  Abdomen:  soft, non-tender   Extremities:  warm, well-perfused, pulses palpable, no LE edema  Rectal/GU  Deferred  Neuro:   Grossly non-focal and symmetrical throughout  Skin:   Clean and dry, no rashes, no breakdown   Diagnostic Tests:  TRANSESOPHOGEAL ECHO REPORT       Patient Name:   Tony [name] Date of Exam: 01/29/2019 Medical Rec #:  [id number]         Height:       70.0 in Accession #:    [id number]        Weight:       160.6 lb Date of Birth:  [birth year] [name]        BSA:          1.90 m Patient Age:    65 years          BP:           160/63 mmHg Patient Gender: M                 HR:           93 bpm. Exam Location:  Inpatient    Procedure: Transesophageal Echo, Color Doppler and Cardiac Doppler  Indications:     Prosthetic Valve Aortic Stenosis   History:         Patient has prior history  of Echocardiogram examinations, most                  recent 09/24/2017.   Sonographer:     Raquel Sarna Senior RDCS Referring Phys:  [place] Diagnosing Phys: Tony Ruths MD     PROCEDURE: Patients was under conscious sedation during this procedure. Anesthetic was administered intravenously by performing Physician: 13mcg of Fentanyl, 6mg  of Versed. The transesophogeal probe was passed through the esophogus of the patient. The  patient developed no complications during  the procedure.  IMPRESSIONS    1. Left ventricular ejection fraction, by visual estimation, is 55 to 60%. The left ventricle has normal function. There is mildly increased left ventricular hypertrophy.  2. Global right ventricle has normal systolic function.The right ventricular size is normal.  3. Left atrial size was moderately dilated.  4. Right atrial size was normal.  5. The mitral valve is grossly normal. Moderate mitral valve regurgitation.  6. The tricuspid valve is normal in structure. Tricuspid valve regurgitation is trivial.  7. Aortic valve regurgitation is moderate. Severe aortic valve stenosis.  8. The pulmonic valve was not well visualized. Pulmonic valve regurgitation is not visualized.  9. Moderate plaque invoving the descending aorta. 10. Normal LV function; s/p AVR with severe AS (mean gradient 51 mmg) and moderate AI; there is an echolucent space contiguous to prosthetic aortic valve with flow; ? prior abscess cavity; moderate MR; trace TR.  FINDINGS  Left Ventricle: Left ventricular ejection fraction, by visual estimation, is 55 to 60%. The left ventricle has normal function. There is mildly increased left ventricular hypertrophy.  Right Ventricle: The right ventricular size is normal. Global RV systolic function is has normal systolic function.  Left Atrium: Left atrial size was moderately dilated.  Right Atrium: Right atrial size was normal in size  Pericardium: There is no evidence of pericardial effusion.  Mitral Valve: The mitral valve is grossly normal. Moderate mitral valve regurgitation.  Tricuspid Valve: The tricuspid valve is normal in structure. Tricuspid valve regurgitation is trivial.  Aortic Valve: The aortic valve has been repaired/replaced. Aortic valve regurgitation is moderate. Severe aortic stenosis is present. Aortic valve mean gradient measures 51.0 mmHg. Aortic valve peak gradient measures 78.1 mmHg.  Pulmonic Valve: The pulmonic  valve was not well visualized. Pulmonic valve regurgitation is not visualized.  Aorta: The aortic root is normal in size and structure. There is moderate plaque involving the descending aorta.  Shunts: No atrial level shunt detected by color flow Doppler.    AORTIC VALVE               Normals AV Vmax:      442.00 cm/s AV Vmean:     342.000 cm/s 77 cm/s AV VTI:       1.110 m      3.15 cm2 AV Peak Grad: 78.1 mmHg AV Mean Grad: 51.0 mmHg    3 mmHg    Tony Ruths MD Electronically signed by Tony Ruths MD Signature Date/Time: 01/29/2019/9:16:18 AM  Physicians Panel Physicians Referring Physician Case Authorizing Physician  Lorretta Harp, MD (Primary)       Procedures RIGHT/LEFT HEART CATH AND CORONARY ANGIOGRAPHY     Conclusion Hemodynamic findings consistent with aortic valve stenosis. Tony [name] is a 74 y.o. [name]  [id number]  LOCATION: FACILITY: [facility]  PHYSICIAN: Quay Burow, M.D.  Tony [name], Tony [name]  DATE OF PROCEDURE: 01/29/2019  DATE OF DISCHARGE:   CARDIAC CATHETERIZATION  History obtained from chart review.Tony [name] is  a 74 y.o. married Caucasian [name] father of 2 children, grandfather of 61 grandchildren referred by Dr. Claudie Leach for TEE, right left heart cath because of progressive prosthetic aortic valves stenosis and associated symptoms of dyspnea and chest pressure. 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 Mr. Factors include tobacco abuse currently smoking 2 cigars a week previously up to 8-10. He has treated hypertension, diabetes and hyperlipidemia. He is never had a heart attack or stroke. His brother did have bicuspid aortic stenosis and AVR at age 28. 2D echo performed by Dr. Claudie Leach revealed preserved LV function with a worsening aortic valve gradient. He presents today for transesophageal echo performed this morning by Dr. Stanford Breed, right and left heart cath to define his anatomy and physiology.    IMPRESSION: Mr. Lacinda Axon has preserved LV function, severe aortic stenosis and a prosthetic valve with moderate aortic insufficiency, and normal coronary arteries with a left dominant system. He will need aortic valve replacement. It is unclear whether this can be done using TAVR. The sheath was removed and a TR band was placed on the right wrist to achieve patent hemostasis. The patient left the lab in stable condition. He will be discharged home later today as an outpatient and will follow up with me next week. I discussed the results with the referring cardiologist, Dr. Claudie Leach.  Quay Burow. MD, [facility]  01/29/2019  4:29 PM      Recommendations Antiplatelet/Anticoag Recommend Aspirin 81mg  daily for moderate CAD.        Indications Severe aortic stenosis [I35.0 (ICD-10-CM)]     Procedural Details Technical Details PROCEDURE DESCRIPTION:   The patient was brought to the second floor  Cardiac cath lab in the postabsorptive state. He was premedicated with IV Versed and fentanyl. His right wrist and antecubital fossaWere prepped and shaved in usual sterile fashion. Xylocaine 1% was used for local anesthesia. A 6 French sheath was inserted into the right radial artery using standard Seldinger technique. The patient received 3500 units of heparin intravenously. A 5 French sheath was inserted into the right antecubital vein. A 5 French balloontipped Swan-Ganz catheter was advanced through the right heart chambers obtaining sequential pressures and blood samples for the determination of Fick cardiac output. A 5 [place] TIG catheter was used for coronary angiography. On the pigtail was used for the entirety of the case. Retroaortic pressures monitored in the case. The patient received radial cocktail via the SideArm sheath.  Estimated blood loss <50 mL.   During this procedure medications were administered to achieve and maintain moderate conscious sedation while the patient's heart rate,  blood pressure, and oxygen saturation were continuously monitored and I was present face-to-face 100% of this time.     Medications (Filter: Administrations occurring from 01/29/19 1525 to 01/29/19 1625)  Continuous medications are totaled by the amount administered until 01/29/19 1625.  midazolam (VERSED) injection (mg) Total dose: 0.5 mg  Date/Time   Rate/Dose/Volume Action  01/29/19 1539  0.5 mg Given  fentaNYL (SUBLIMAZE) injection (mcg) Total dose: 25 mcg  Date/Time   Rate/Dose/Volume Action  01/29/19 1539  25 mcg Given  lidocaine (PF) (XYLOCAINE) 1 % injection (mL) Total volume: 4 mL  Date/Time   Rate/Dose/Volume Action  01/29/19 1552  2 mL Given  1558  2 mL Given  Radial Cocktail/Verapamil only (mL) Total volume: 0 mL  Date/Time   Rate/Dose/Volume Action  01/29/19 1559  10 mL Canceled Entry  Radial Cocktail (  Verapamil 2.5 mg, NTG, Lidocaine) (mL) Total volume: 5 mL  Date/Time   Rate/Dose/Volume Action  01/29/19 1600  5 mL Given  heparin injection (Units) Total dose: 3,500 Units  Date/Time   Rate/Dose/Volume Action  01/29/19 1601  3,500 Units Given  iohexol (OMNIPAQUE) 350 MG/ML injection (mL) Total volume: 55 mL  Date/Time   Rate/Dose/Volume Action  01/29/19 1611  55 mL Given  Heparin (Porcine) in NaCl 1000-0.9 UT/500ML-% SOLN (mL) Total volume: 1,500 mL  Date/Time   Rate/Dose/Volume Action  01/29/19 1611  500 mL Given  1611  500 mL Given  1611  500 mL Given     Sedation Time Sedation Time Physician-1: 27 minutes 30 seconds        Contrast Medication Name Total Dose  iohexol (OMNIPAQUE) 350 MG/ML injection 55 mL     Radiation/Fluoro Fluoro time: 5.4 (min)  DAP: 15302 (mGycm2)  Cumulative Air Kerma: 226 (mGy)        Coronary Findings Diagnostic Dominance: Left  No diagnostic findings have been documented.  Intervention No interventions have been documented.          Right Heart Right Heart Pressures Hemodynamic findings  consistent with aortic valve stenosis. 1: Right atrial pressure: 4/4 2: Right ventricular pressure-37/4 3: Pulmonary artery pressure-39/24, mean 31 4: Pulmonary wedge pressure-17/17, mean 17 Cardiac output-4.2 L/min with an index of 2.2 L/min/m                    Coronary Diagrams Diagnostic Dominance: Left  &&&&&  Intervention      Implants  No implant documentation for this case.      Syngo Images Link to Procedure Log  Show images for CARDIAC CATHETERIZATION Procedure Log     Images on Long Term Storage   Show images for [place], [place] Data   Most Recent Value  Fick Cardiac Output 4.16 L/min  Fick Cardiac Output Index 2.19 (L/min)/BSA  RA A Wave 4 mmHg  RA V Wave 4 mmHg  RA Mean 4 mmHg  RV Systolic Pressure 37 mmHg  RV Diastolic Pressure 4 mmHg  RV EDP 6 mmHg  PA Systolic Pressure 39 mmHg  PA Diastolic Pressure 24 mmHg  PA Mean 31 mmHg  PW A Wave 17 mmHg  PW V Wave 17 mmHg  PW Mean 17 mmHg  AO Systolic Pressure [id number] mmHg  AO Diastolic Pressure 57 mmHg  AO Mean 85 mmHg  QP/QS 1  TPVR Index 14.19 HRUI  TSVR Index 38.89 HRUI  PVR SVR Ratio 0.17  TPVR/TSVR Ratio 0.36   ADDENDUM REPORT: 03/24/2019 08:50  CLINICAL DATA:  Prosthetic aortic valve stenosis and regurgitation  EXAM: Cardiac TAVR CT  TECHNIQUE: The patient was scanned on a Siemens Force [id number] slice scanner. A 120 kV retrospective scan was triggered in the descending thoracic aorta at 111 HU's. Gantry rotation speed was 270 msecs and collimation was .9 mm. No beta blockade or nitro were given. The 3D data set was reconstructed in 5% intervals of the R-R cycle. Systolic and diastolic phases were analyzed on a dedicated work station using MPR, MIP and VRT modes. The patient received 188mL OMNIPAQUE IOHEXOL 350 MG/ML SOLN of contrast.  FINDINGS: Aortic Valve: Bioprosthetic 23 mm St Jude Trifecta bovine pericardial tissue valve with mildly thickened and mildly  calcified valve leaflets. The leaflet adjacent to the right coronary cusp and left coronary cusp have severely restricted motion. Possible deformity of the stents  adjacent to left coronary cusp and right coronary cusp, stents appear bent inward toward the valve leaflets.  Sewing ring perforation between right coronary cusp and non coronary cusp.  Virtual Basal Annulus Measurements:  Valve measures 20 x 20 mm  No significant LVOT calcifications.  For valve in valve TAVR, the annulus may be suitable for a 23 mm Sapien S3 valve.  Aorta: Short segment common origin of the right brachiocephalic and left common carotid arteries. Normal location for takeoff of left subclavian artery. Moderate mixed atherosclerotic plaque in the aortic arch and descending thoracic aorta. No evidence of coarctation of the thoracic aorta.  Sinus of Valsalva measures 41 mm in maximal dimension.  Sinotubular Junction: 27 mm  Ascending Thoracic Aorta: 37 mm  Aortic Arch: 30 mm  Minimum diameter of the proximal descending thoracic aorta is 26 mm, with maximum diameter of the proximal descending thoracic aorta 31 mm.  Mid descending thoracic aorta: 26 mm  Coronary Artery Height above Annulus:  Left Main: 10 mm  Right Coronary: 10 mm  Prosthesis to LM distance 5.9 mm  Prosthesis to RCA distance 4.4 mm  Coronary Arteries: 3 vessel coronary artery calcifications.  IMPRESSION: 1. Bioprosthetic 23 mm St Jude Trifecta bovine pericardial tissue valve with mildly thickened and mildly calcified valve leaflets. The leaflet adjacent to the right coronary cusp and left coronary cusp have restricted motion. Possible deformity of the stents adjacent to left coronary cusp and right coronary cusp, stents appear bent inward toward the valve leaflets.  2. Sewing ring perforation between right coronary cusp and non coronary cusp. No evidence of annular abscess.  3. Valve measures 20 x  20 mm  4. Sinus of Valsalva measures 41 mm in maximal dimension. Mid ascending aorta 37 mm, proximal descending aorta measure 66mm.  5. Adequate coronary distance from prosthesis for valve in valve TAVR.  6. For valve in valve TAVR, the annulus may be suitable for a 23 mm Sapien S3 valve. However, given perforation of prosthetic valve sewing ring, Structural Heart Team discussion recommended.   Electronically Signed   By: Cherlynn Kaiser   On: 03/24/2019 08:50      STS Risk Calculator (Isolated redo AVR): Risk of Mortality: 1.749% Renal Failure: 1.124% Permanent Stroke: 1.459% Prolonged Ventilation: 5.151% DSW Infection: 0.130% Reoperation: 4.804% Morbidity or Mortality: 10.254% Short Length of Stay: 48.453% Long Length of Stay: 4.110%  Impression:  This 74 year old gentleman has severe bioprosthetic aortic valve stenosis and moderate aortic insufficiency following aortic valve replacement in [place] in 2015.  I have personally reviewed his transesophageal echo, cardiac catheterization, and gated cardiac CTA studies.  His echocardiogram shows a mean gradient of 51 mmHg across the prosthetic aortic valve suggesting severe aortic stenosis.  There is at least moderate aortic insufficiency and there appears to be significant AI outside of the valve ring suggesting a perivalvular leak.  His gated cardiac CTA also shows a lucent area outside the valve ring that may be a perivalvular leak.  Left ventricular systolic function is normal with ejection fraction of 55 to 60% with mild LVH.  Right ventricular systolic function is normal.  There is moderate mitral regurgitation and trace tricuspid regurgitation.  Cardiac catheterization shows normal coronary arteries with a left dominant system.  I agree that aortic valve replacement is indicated in this patient with evidence of severe aortic stenosis and moderate to severe aortic insufficiency with progressive symptoms.  He  was discussed at our multidisciplinary heart valve meeting and the concern is  that he likely has a perivalvular leak that may have been present from shortly after his initial surgery since an early postoperative echo had already shown a leak.  Unfortunately we do not have the details of that.  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  He would be a candidate for valve in valve TAVR if he did not have a significant perivalvular leak.  TAVR would not change this leak and would leave him with continued problems.  I think the best option for him would be redo sternotomy and redo aortic valve replacement using a bioprosthetic valve.  The only other option would be to try to plug the paravalvular leak percutaneously and if successful then proceed with TAVR.  I think redo surgical AVR would be the best option for him since his surgical risk is still relatively low.  He is not sure what he wants to do but is going, think about it and talk to his family.  Plan:  He will call us back to let us know if he wants to proceed with redo sternotomy for redo aortic valve replacement.  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     Gaye Pollack, MD 03/25/2019

## 2019-03-27 ENCOUNTER — Inpatient Hospital Stay (HOSPITAL_COMMUNITY): Admission: RE | Admit: 2019-03-27 | Payer: Medicare Other | Source: Ambulatory Visit

## 2019-03-27 ENCOUNTER — Other Ambulatory Visit (HOSPITAL_COMMUNITY): Payer: Medicare Other

## 2019-04-03 ENCOUNTER — Encounter: Payer: Self-pay | Admitting: *Deleted

## 2019-04-03 ENCOUNTER — Other Ambulatory Visit: Payer: Self-pay | Admitting: *Deleted

## 2019-04-03 DIAGNOSIS — I351 Nonrheumatic aortic (valve) insufficiency: Secondary | ICD-10-CM

## 2019-04-03 DIAGNOSIS — I350 Nonrheumatic aortic (valve) stenosis: Secondary | ICD-10-CM

## 2019-04-08 NOTE — Pre-Procedure Instructions (Signed)
Your procedure is scheduled on Monday, February 15th, from 07:30 AM to 1:30 PM.  Report to [facility] Main Entrance "A" at 05:30 A.M., and check in at the Admitting office.  Call this number if you have problems the morning of surgery:  [phone]  Call [phone] if you have any questions prior to your surgery date Monday-Friday 8am-4pm.    Remember:  Do not eat or drink after midnight the night before your surgery.     Take these medicines the morning of surgery with A SIP OF WATER : escitalopram (LEXAPRO)  metoprolol succinate (TOPROL XL) omeprazole (PRILOSEC) primidone (MYSOLINE)   IF NEEDED: fluticasone (FLONASE)  As of today, STOP taking any Aspirin (unless otherwise instructed by your surgeon), Aleve, Naproxen, Ibuprofen, Motrin, Advil, Goody's, BC's, all herbal medications, fish oil, and all vitamins.    WHAT DO I DO ABOUT MY DIABETES MEDICATION?   . THE NIGHT BEFORE SURGERY, take 35 units of insulin glargine (LANTUS).       . THE MORNING OF SURGERY, take 35 units of insulin glargine (LANTUS).    Do not take metFORMIN (GLUCOPHAGE) the morning of surgery.    HOW TO MANAGE YOUR DIABETES BEFORE AND AFTER SURGERY  Why is it important to control my blood sugar before and after surgery? . Improving blood sugar levels before and after surgery helps healing and can limit problems. . A way of improving blood sugar control is eating a healthy diet by: o  Eating less sugar and carbohydrates o  Increasing activity/exercise o  Talking with your doctor about reaching your blood sugar goals . High blood sugars (greater than 180 mg/dL) can raise your risk of infections and slow your recovery, so you will need to focus on controlling your diabetes during the weeks before surgery. . Make sure that the doctor who takes care of your diabetes knows about your planned surgery including the date and location.  How do I manage my blood sugar before surgery? . Check your  blood sugar at least 4 times a day, starting 2 days before surgery, to make sure that the level is not too high or low. . Check your blood sugar the morning of your surgery when you wake up and every 2 hours until you get to the Short Stay unit. o If your blood sugar is less than 70 mg/dL, you will need to treat for low blood sugar: - Do not take insulin. - Treat a low blood sugar (less than 70 mg/dL) with  cup of clear juice (cranberry or apple), 4 glucose tablets, OR glucose gel. - Recheck blood sugar in 15 minutes after treatment (to make sure it is greater than 70 mg/dL). If your blood sugar is not greater than 70 mg/dL on recheck, call [phone] for further instructions. . Report your blood sugar to the short stay nurse when you get to Short Stay.  . If you are admitted to the hospital after surgery: o Your blood sugar will be checked by the staff and you will probably be given insulin after surgery (instead of oral diabetes medicines) to make sure you have good blood sugar levels. o The goal for blood sugar control after surgery is 80-180 mg/dL.    The Morning of Surgery  Do not wear jewelry.  Do not wear lotions, powders, colognes, or deodorant.  Men may shave face and neck.  Do not bring valuables to the hospital.  [facility] is not responsible for any belongings or valuables.  If  you are a smoker, DO NOT Smoke 24 hours prior to surgery.  If you wear a CPAP at night please bring your mask the morning of surgery .  Remember that you must have someone to transport you home after your surgery, and remain with you for 24 hours if you are discharged the same day.   Please bring cases for contacts, glasses, hearing aids, dentures or bridgework because it cannot be worn into surgery.    Leave your suitcase in the car.  After surgery it may be brought to your room.  For patients admitted to the hospital, discharge time will be determined by your treatment team.  Patients  discharged the day of surgery will not be allowed to drive home.    Special instructions:   [place]- Preparing For Surgery  Before surgery, you can play an important role. Because skin is not sterile, your skin needs to be as free of germs as possible. You can reduce the number of germs on your skin by washing with CHG (chlorahexidine gluconate) Soap before surgery.  CHG is an antiseptic cleaner which kills germs and bonds with the skin to continue killing germs even after washing.    Oral Hygiene is also important to reduce your risk of infection.  Remember - BRUSH YOUR TEETH THE MORNING OF SURGERY WITH YOUR REGULAR TOOTHPASTE.  Please do not use if you have an allergy to CHG or antibacterial soaps. If your skin becomes reddened/irritated stop using the CHG.  Do not shave (including legs and underarms) for at least 48 hours prior to first CHG shower. It is OK to shave your face.  Please follow these instructions carefully.   1. Shower the NIGHT BEFORE SURGERY and the MORNING OF SURGERY with CHG Soap.   2. If you chose to wash your hair, wash your hair first as usual with your normal shampoo.  3. After you shampoo, rinse your hair and body thoroughly to remove the shampoo.  4. Use CHG as you would any other liquid soap. You can apply CHG directly to the skin and wash gently with a scrungie or a clean washcloth.   5. Apply the CHG Soap to your body ONLY FROM THE NECK DOWN.  Do not use on open wounds or open sores. Avoid contact with your eyes, ears, mouth and genitals (private parts). Wash Face and genitals (private parts)  with your normal soap.   6. Wash thoroughly, paying special attention to the area where your surgery will be performed.  7. Thoroughly rinse your body with warm water from the neck down.  8. DO NOT shower/wash with your normal soap after using and rinsing off the CHG Soap.  9. Pat yourself dry with a CLEAN TOWEL.  10. Wear CLEAN PAJAMAS to bed the night before  surgery, wear comfortable clothes the morning of surgery  11. Place CLEAN SHEETS on your bed the night of your first shower and DO NOT SLEEP WITH PETS.    Day of Surgery:  Please shower the morning of surgery with the CHG soap. Do not apply any deodorants/lotions. Please wear clean clothes to the hospital/surgery center.   Remember to brush your teeth WITH YOUR REGULAR TOOTHPASTE.   Please read over the following fact sheets that you were given.

## 2019-04-09 ENCOUNTER — Encounter (HOSPITAL_COMMUNITY): Payer: Self-pay

## 2019-04-09 ENCOUNTER — Ambulatory Visit (HOSPITAL_COMMUNITY)
Admission: RE | Admit: 2019-04-09 | Discharge: 2019-04-09 | Disposition: A | Discharge status: Home / Self Care | Payer: Medicare Other | Source: Ambulatory Visit | Attending: Surgery | Admitting: Surgery

## 2019-04-09 ENCOUNTER — Other Ambulatory Visit: Payer: Self-pay

## 2019-04-09 ENCOUNTER — Other Ambulatory Visit (HOSPITAL_COMMUNITY)
Admission: RE | Admit: 2019-04-09 | Discharge: 2019-04-09 | Disposition: A | Discharge status: Home / Self Care | Payer: Medicare Other | Source: Ambulatory Visit | Attending: Surgery | Admitting: Surgery

## 2019-04-09 ENCOUNTER — Encounter (HOSPITAL_COMMUNITY)
Admission: RE | Admit: 2019-04-09 | Discharge: 2019-04-09 | Disposition: A | Discharge status: Home / Self Care | Payer: Medicare Other | Source: Ambulatory Visit | Attending: Surgery | Admitting: Surgery

## 2019-04-09 DIAGNOSIS — Z01818 Encounter for other preprocedural examination: Secondary | ICD-10-CM | POA: Insufficient documentation

## 2019-04-09 DIAGNOSIS — E119 Type 2 diabetes mellitus without complications: Secondary | ICD-10-CM | POA: Diagnosis not present

## 2019-04-09 DIAGNOSIS — Z20822 Contact with and (suspected) exposure to covid-19: Secondary | ICD-10-CM | POA: Insufficient documentation

## 2019-04-09 DIAGNOSIS — I119 Hypertensive heart disease without heart failure: Secondary | ICD-10-CM | POA: Insufficient documentation

## 2019-04-09 DIAGNOSIS — I351 Nonrheumatic aortic (valve) insufficiency: Secondary | ICD-10-CM | POA: Diagnosis not present

## 2019-04-09 DIAGNOSIS — I352 Nonrheumatic aortic (valve) stenosis with insufficiency: Secondary | ICD-10-CM | POA: Diagnosis not present

## 2019-04-09 DIAGNOSIS — I350 Nonrheumatic aortic (valve) stenosis: Secondary | ICD-10-CM

## 2019-04-09 DIAGNOSIS — R9431 Abnormal electrocardiogram [ECG] [EKG]: Secondary | ICD-10-CM | POA: Diagnosis not present

## 2019-04-09 HISTORY — DX: Malignant (primary) neoplasm, unspecified: C80.1

## 2019-04-09 HISTORY — DX: Gastro-esophageal reflux disease without esophagitis: K21.9

## 2019-04-09 HISTORY — DX: Personal history of urinary calculi: Z87.442

## 2019-04-09 HISTORY — DX: Unspecified cirrhosis of liver: K74.60

## 2019-04-09 HISTORY — DX: Pneumonia, unspecified organism: J18.9

## 2019-04-09 HISTORY — DX: Anxiety disorder, unspecified: F41.9

## 2019-04-09 HISTORY — DX: Unspecified osteoarthritis, unspecified site: M19.90

## 2019-04-09 HISTORY — DX: Dyspnea, unspecified: R06.00

## 2019-04-09 HISTORY — DX: Cardiac murmur, unspecified: R01.1

## 2019-04-09 LAB — URINALYSIS, ROUTINE W REFLEX MICROSCOPIC
Bacteria, UA: NONE SEEN
Bilirubin Urine: NEGATIVE
Glucose, UA: >=500 mg/dL — AB
Hgb urine dipstick: NEGATIVE
Ketones, ur: 5 mg/dL — AB
Leukocytes,Ua: NEGATIVE
Nitrite: NEGATIVE
Protein, ur: 100 mg/dL — AB
Specific Gravity, Urine: 1.023 (ref 1.005–1.030)
pH: 5 (ref 5.0–8.0)

## 2019-04-09 LAB — CBC
HCT: 43.8 % (ref 39.0–52.0)
Hemoglobin: 14.2 g/dL (ref 13.0–17.0)
MCH: 31.2 pg (ref 26.0–34.0)
MCHC: 32.4 g/dL (ref 30.0–36.0)
MCV: 96.3 fL (ref 80.0–100.0)
Platelets: 217 10*3/uL (ref 150–400)
RBC: 4.55 MIL/uL (ref 4.22–5.81)
RDW: 13.7 % (ref 11.5–15.5)
WBC: 8.1 10*3/uL (ref 4.0–10.5)
nRBC: 0 % (ref 0.0–0.2)

## 2019-04-09 LAB — PROTIME-INR
INR: 1.1 (ref 0.8–1.2)
Prothrombin Time: 13.9 seconds (ref 11.4–15.2)

## 2019-04-09 LAB — COMPREHENSIVE METABOLIC PANEL
ALT: 54 U/L — ABNORMAL HIGH (ref 0–44)
AST: 68 U/L — ABNORMAL HIGH (ref 15–41)
Albumin: 4 g/dL (ref 3.5–5.0)
Alkaline Phosphatase: 45 U/L (ref 38–126)
Anion gap: 11 (ref 5–15)
BUN: 10 mg/dL (ref 8–23)
CO2: 22 mmol/L (ref 22–32)
Calcium: 8.9 mg/dL (ref 8.9–10.3)
Chloride: 98 mmol/L (ref 98–111)
Creatinine, Ser: 0.82 mg/dL (ref 0.61–1.24)
GFR calc Af Amer: >60 mL/min (ref >60)
GFR calc non Af Amer: >60 mL/min (ref >60)
Glucose, Bld: 256 mg/dL — ABNORMAL HIGH (ref 70–99)
Potassium: 4.2 mmol/L (ref 3.5–5.1)
Sodium: 131 mmol/L — ABNORMAL LOW (ref 135–145)
Total Bilirubin: 2.1 mg/dL — ABNORMAL HIGH (ref 0.3–1.2)
Total Protein: 7.2 g/dL (ref 6.5–8.1)

## 2019-04-09 LAB — ABO/RH: ABO/RH(D): A POS

## 2019-04-09 LAB — BLOOD GAS, ARTERIAL
Acid-base deficit: 0.1 mmol/L (ref 0.0–2.0)
Bicarbonate: 23.5 mmol/L (ref 20.0–28.0)
Drawn by: 421801
FIO2: 21
O2 Saturation: 98.4 %
Patient temperature: 37
pCO2 arterial: 34.7 mmHg (ref 32.0–48.0)
pH, Arterial: 7.444 (ref 7.350–7.450)
pO2, Arterial: 115 mmHg — ABNORMAL HIGH (ref 83.0–108.0)

## 2019-04-09 LAB — APTT: aPTT: 27 seconds (ref 24–36)

## 2019-04-09 LAB — SARS CORONAVIRUS 2 (TAT 6-24 HRS): SARS Coronavirus 2: NEGATIVE

## 2019-04-09 LAB — SURGICAL PCR SCREEN
MRSA, PCR: NEGATIVE
Staphylococcus aureus: NEGATIVE

## 2019-04-09 LAB — HEMOGLOBIN A1C
Hgb A1c MFr Bld: 7.4 % — ABNORMAL HIGH (ref 4.8–5.6)
Mean Plasma Glucose: 165.68 mg/dL

## 2019-04-09 LAB — GLUCOSE, CAPILLARY: Glucose-Capillary: 217 mg/dL — ABNORMAL HIGH (ref 70–99)

## 2019-04-09 NOTE — Progress Notes (Signed)
Pre cabg has been completed.   Preliminary results in CV Proc.   Tony [name] 04/09/2019 10:18 AM

## 2019-04-09 NOTE — Progress Notes (Signed)
PCP - Windell Hummingbird, PA-C Cardiologist - Quay Burow, MD, referred by Lawson Radar, MD; and formerly Floreen Comber, MD in [place], [place]  PPM/ICD - Denies  Chest x-ray - 04/09/2019 EKG - 04/09/2019 Stress Test - Patient Unsure; patient's wife thinks he may have had one done by Dr. Gwinda Passe; records requested. ECHO - 01/29/2019 Cardiac Cath - 01/29/2019  Sleep Study - Denies  Fasting Blood Sugar - 100-130 Checks Blood Sugar  1-3 times a day  Blood Thinner Instructions: N/A Aspirin Instructions: N/A  ERAS Protcol - N/A PRE-SURGERY Ensure or G2- N/A  COVID TEST- 04/09/2019   Anesthesia review: Yes, Cardiac Hx.  Patient denies shortness of breath, fever, cough and chest pain at PAT appointment   All instructions explained to the patient, with a verbal understanding of the material. Patient agrees to go over the instructions while at home for a better understanding. Patient also instructed to self quarantine after being tested for COVID-19. The opportunity to ask questions was provided.

## 2019-04-09 NOTE — Progress Notes (Signed)
   04/09/19 1140  OBSTRUCTIVE SLEEP APNEA  Have you ever been diagnosed with sleep apnea through a sleep study? No  Do you snore loudly (loud enough to be heard through closed doors)?  1  Do you often feel tired, fatigued, or sleepy during the daytime (such as falling asleep during driving or talking to someone)? 0  Has anyone observed you stop breathing during your sleep? 1  Do you have, or are you being treated for high blood pressure? 1  BMI more than 35 kg/m2? 0  Age > 50 (1-yes) 1  Neck circumference greater than:Male 16 inches or larger, Male 17inches or larger? 0  Male Gender (Yes=1) 1  Obstructive Sleep Apnea Score 5

## 2019-04-10 ENCOUNTER — Telehealth: Payer: Self-pay

## 2019-04-10 DIAGNOSIS — G4701 Insomnia due to medical condition: Secondary | ICD-10-CM

## 2019-04-10 MED ORDER — TRANEXAMIC ACID (OHS) PUMP PRIME SOLUTION
2.0000 mg/kg | INTRAVENOUS | Status: DC
  Filled 2019-04-10: qty 1.45

## 2019-04-10 MED ORDER — ZOLPIDEM TARTRATE 10 MG PO TABS: 10.0000 mg | ORAL_TABLET | Freq: Every evening | ORAL | 0 refills | Status: DC | PRN

## 2019-04-10 MED ORDER — PHENYLEPHRINE HCL-NACL 20-0.9 MG/250ML-% IV SOLN
30.0000 ug/min | INTRAVENOUS | Status: AC
  Administered 2019-04-13: 08:00:00 20 ug/min via INTRAVENOUS
  Filled 2019-04-10: qty 250

## 2019-04-10 MED ORDER — NOREPINEPHRINE 4 MG/250ML-% IV SOLN
0.0000 ug/min | INTRAVENOUS | Status: DC
  Filled 2019-04-10: qty 250

## 2019-04-10 MED ORDER — DEXMEDETOMIDINE HCL IN NACL 400 MCG/100ML IV SOLN
0.1000 ug/kg/h | INTRAVENOUS | Status: AC
  Administered 2019-04-13: .3 ug/kg/h via INTRAVENOUS
  Filled 2019-04-10: qty 100

## 2019-04-10 MED ORDER — SODIUM CHLORIDE 0.9 % IV SOLN
750.0000 mg | INTRAVENOUS | Status: AC
  Administered 2019-04-13: 14:00:00 750 mg via INTRAVENOUS
  Filled 2019-04-10: qty 750

## 2019-04-10 MED ORDER — TRANEXAMIC ACID (OHS) BOLUS VIA INFUSION
15.0000 mg/kg | INTRAVENOUS | Status: AC
  Administered 2019-04-13: 08:00:00 1084.5 mg via INTRAVENOUS
  Filled 2019-04-10: qty 1085

## 2019-04-10 MED ORDER — MILRINONE LACTATE IN DEXTROSE 20-5 MG/100ML-% IV SOLN
0.3000 ug/kg/min | INTRAVENOUS | Status: DC
  Filled 2019-04-10: qty 100

## 2019-04-10 MED ORDER — EPINEPHRINE HCL 5 MG/250ML IV SOLN IN NS
0.0000 ug/min | INTRAVENOUS | Status: DC
  Filled 2019-04-10: qty 250

## 2019-04-10 MED ORDER — POTASSIUM CHLORIDE 2 MEQ/ML IV SOLN
80.0000 meq | INTRAVENOUS | Status: DC
  Filled 2019-04-10: qty 40

## 2019-04-10 MED ORDER — SODIUM CHLORIDE 0.9 % IV SOLN
INTRAVENOUS | Status: DC
  Filled 2019-04-10: qty 30

## 2019-04-10 MED ORDER — VANCOMYCIN HCL 1250 MG/250ML IV SOLN
1250.0000 mg | INTRAVENOUS | Status: AC
  Administered 2019-04-13: 08:00:00 1250 mg via INTRAVENOUS
  Filled 2019-04-10: qty 250

## 2019-04-10 MED ORDER — NITROGLYCERIN IN D5W 200-5 MCG/ML-% IV SOLN
2.0000 ug/min | INTRAVENOUS | Status: DC
  Filled 2019-04-10: qty 250

## 2019-04-10 MED ORDER — PLASMA-LYTE 148 IV SOLN
INTRAVENOUS | Status: DC
  Filled 2019-04-10: qty 2.5

## 2019-04-10 MED ORDER — INSULIN REGULAR(HUMAN) IN NACL 100-0.9 UT/100ML-% IV SOLN
INTRAVENOUS | Status: AC
  Administered 2019-04-13: 08:00:00 5.5 [IU]/h via INTRAVENOUS
  Filled 2019-04-10: qty 100

## 2019-04-10 MED ORDER — SODIUM CHLORIDE 0.9 % IV SOLN
1.5000 g | INTRAVENOUS | Status: AC
  Administered 2019-04-13: 08:00:00 1.5 g via INTRAVENOUS
  Filled 2019-04-10 (×2): qty 1.5

## 2019-04-10 MED ORDER — TRANEXAMIC ACID 1000 MG/10ML IV SOLN
1.5000 mg/kg/h | INTRAVENOUS | Status: AC
  Administered 2019-04-13: 09:00:00 1.5 mg/kg/h via INTRAVENOUS
  Filled 2019-04-10: qty 25

## 2019-04-10 MED ORDER — MAGNESIUM SULFATE 50 % IJ SOLN
40.0000 meq | INTRAMUSCULAR | Status: DC
  Filled 2019-04-10: qty 9.85

## 2019-04-10 NOTE — Telephone Encounter (Signed)
RX for Ambien 10 mg called to pharm.  Take 1 tablet @ hs prn, #3, no refills

## 2019-04-11 NOTE — H&P (Signed)
[address]       [place] [address]             [phone]      Cardiothoracic Surgery Admission History and Physical   Referring Provider is Lorretta Harp, MD  Primary Cardiologist is No primary care provider on file.  PCP is Windell Hummingbird, PA-C      Chief Complaint  Patient presents with  . Bioprosthetic aortic valve stenosis and insufficiency      HPI:  The patient is a 74 year old gentleman with a history of bicuspid aortic valve stenosis who underwent surgical aortic valve replacement using a 23 mm St Jude Trifecta bovine pericardial valve in 2015 at [facility] in [place], [place]. He reports an uneventful postoperative course although he felt like he never completely recovered. He said that a repeat echocardiogram within 30 days of surgery showed that the valve developed a leak. The report from that echo showed mild to moderate aortic insufficiency with a pressure half-time of 302 ms. They did not comment on whether the regurgitation was within the valve or perivalvular. Patient had normal left ventricular systolic function with evidence of diastolic dysfunction. There was mild to moderate mitral regurgitation. Then in 2019 he was found to have Enterococcus faecalis bacteremia which she developed after repeated cystoscopy for surveillance and treatment of a bladder cancer. He reportedly had an TEE at that time that was negative for any findings consistent with endocarditis. He was treated with 6 weeks of ampicillin and ceftriaxone and recovered. He said that he did not have any specific symptoms until the past 1 to 2 months when he developed exertional shortness of breath and fatigue, dizziness, and some chest discomfort with activity. He said that he is getting short of breath walking from room to room in his house. He denies orthopnea. He has had some issues with balance and uses a cane.       Past Medical History:  Diagnosis Date  . Diabetes mellitus without  complication ([place])   . H/O aortic valve replacement 01/19/2019   Aortic valve replacement  . HTN (hypertension)         Past Surgical History:  Procedure Laterality Date  . RIGHT/LEFT HEART CATH AND CORONARY ANGIOGRAPHY N/A 01/29/2019   Procedure: RIGHT/LEFT HEART CATH AND CORONARY ANGIOGRAPHY; Surgeon: Lorretta Harp, MD; Location: [facility] [facility]; Service: Cardiovascular; Laterality: N/A;  . TEE WITHOUT CARDIOVERSION N/A 01/29/2019   Procedure: TRANSESOPHAGEAL ECHOCARDIOGRAM (TEE); Surgeon: Lelon Perla, MD; Location: [facility] ENDOSCOPY; Service: Cardiovascular; Laterality: N/A;   History reviewed. No pertinent family history.  Social History        Socioeconomic History  . Marital status: Married    Spouse name: Not on file  . Number of children: Not on file  . Years of education: Not on file  . Highest education level: Not on file  Occupational History  . Not on file  Tobacco Use  . Smoking status: Light Tobacco Smoker  . Smokeless tobacco: Never Used  . Tobacco comment: cigars 2 a week   Substance and Sexual Activity  . Alcohol use: Not on file  . Drug use: Not on file  . Sexual activity: Not on file  Other Topics Concern  . Not on file  Social History Narrative  . Not on file   Social Determinants of Health      Financial Resource Strain:   . Difficulty of Paying Living Expenses: Not on file  Food Insecurity:   . Worried About Charity fundraiser in the Last Year: Not on file  . Ran Out of Food in the Last Year: Not on file  Transportation Needs:   . Lack of Transportation (Medical): Not on file  . Lack of Transportation (Non-Medical): Not on file  Physical Activity:   . Days of Exercise per Week: Not on file  . Minutes of Exercise per Session: Not on file  Stress:   . Feeling of Stress : Not on file  Social Connections:   . Frequency of Communication with Friends and Family: Not on file  . Frequency of Social Gatherings with Friends and Family: Not on  file  . Attends Religious Services: Not on file  . Active Member of Clubs or Organizations: Not on file  . Attends Archivist Meetings: Not on file  . Marital Status: Not on file  Intimate Partner Violence:   . Fear of Current or Ex-Partner: Not on file  . Emotionally Abused: Not on file  . Physically Abused: Not on file  . Sexually Abused: Not on file         Current Outpatient Medications  Medication Sig Dispense Refill  . acidophilus (RISAQUAD) CAPS capsule Take 1 capsule by mouth daily.    Marland Kitchen aspirin EC 81 MG tablet Take 81 mg by mouth daily.     . Coenzyme Q10 100 MG capsule Take 100 mg by mouth daily.     . Cyanocobalamin (B-12) 2500 MCG TABS Take 2,500 mcg by mouth daily.    Marland Kitchen escitalopram (LEXAPRO) 20 MG tablet Take 20 mg by mouth daily.     . Evolocumab (REPATHA SURECLICK) [id number] MG/ML SOAJ Inject 140 mg into the skin every 14 (fourteen) days.     . hydrochlorothiazide (HYDRODIURIL) 12.5 MG tablet Take 12.5 mg by mouth daily.     . insulin glargine (LANTUS) 100 UNIT/ML injection Inject 70 Units into the skin daily.     Marland Kitchen loperamide (IMODIUM A-D) 2 MG tablet Take 2 mg by mouth as needed for diarrhea or loose stools.    . metFORMIN (GLUCOPHAGE) 1000 MG tablet Take 1,000 mg by mouth 2 (two) times daily with a meal.     . metoprolol succinate (TOPROL XL) 50 MG 24 hr tablet Take 50 mg by mouth daily.     . Omega-3 Fatty Acids (FISH OIL) 1000 MG CAPS Take 1,000 mg by mouth daily.    Marland Kitchen omeprazole (PRILOSEC) 20 MG capsule Take 20 mg by mouth daily.    . primidone (MYSOLINE) 50 MG tablet Take 25 mg by mouth 2 (two) times daily.    Marland Kitchen testosterone cypionate (DEPOTESTOSTERONE CYPIONATE) 200 MG/ML injection Inject 200 mg into the muscle every 14 (fourteen) days.              Current Facility-Administered Medications  Medication Dose Route Frequency Provider Last Rate Last Admin  . sodium chloride flush (NS) 0.9 % injection 3 mL 3 mL Intravenous Q12H Lorretta Harp, MD            Allergies  Allergen Reactions  . Codeine Other (See Comments) and Rash    Hallucinations    Review of Systems:   General: + decreased appetite, + decreased energy, no weight gain, + weight loss, no fever  Cardiac: + chest pain with exertion, no chest pain at rest, +SOB with mild exertion, no resting SOB, no PND, no orthopnea, + palpitations, no arrhythmia, no atrial fibrillation, no LE  edema, + dizzy spells, no syncope  Respiratory: + exertional shortness of breath, no home oxygen, no productive cough, no dry cough, no bronchitis, no wheezing, no hemoptysis, no asthma, no pain with inspiration or cough, + sleep apnea, no CPAP at night  GI: no difficulty swallowing, no reflux, no frequent heartburn, no hiatal hernia, no abdominal pain, no constipation, no diarrhea, no hematochezia, no hematemesis, no melena  GU: no dysuria, no frequency, no urinary tract infection, no hematuria, no enlarged prostate, no kidney stones, no kidney disease  Vascular: no pain suggestive of claudication, no pain in feet, no leg cramps, no varicose veins, no DVT, no non-healing foot ulcer  Neuro: no stroke, no TIA's, no seizures, no headaches, no temporary blindness one eye, no slurred speech, no peripheral neuropathy, no chronic pain, + instability of gait, no memory/cognitive dysfunction  Musculoskeletal: + arthritis, no joint swelling, no myalgias, no difficulty walking, normal mobility  Skin: no rash, + itching, no skin infections, no pressure sores or ulcerations  Psych: + anxiety, + depression, no nervousness, no unusual recent stress  Eyes: no blurry vision, no floaters, no recent vision changes, + wears glasses or contacts  ENT: no hearing loss, no loose or painful teeth, no dentures, last saw dentist 07/2018  Hematologic: no easy bruising, no abnormal bleeding, no clotting disorder, no frequent epistaxis  Endocrine: + diabetes, does check CBG's at home    Physical Exam:   BP (!) 145/66 (BP Location: Right  Arm, Patient Position: Sitting, Cuff Size: Normal)  Pulse (!) 102  Temp (!) 97.5 F (36.4 C)  Resp 16  Ht 5\' 10"  (1.778 m)  Wt 162 lb (73.5 kg)  SpO2 98% Comment: RA  BMI 23.24 kg/m  General: well-appearing  HEENT: Unremarkable, NCAT, PERLA, EOMI,  Neck: no JVD, no bruits, no adenopathy  Chest: clear to auscultation, symmetrical breath sounds, no wheezes, no rhonchi, old sternotomy scar  CV: RRR, grade lll/VI crescendo/decrescendo murmur heard best at RSB, 3/6 diastolic murmur at apex  Abdomen: soft, non-tender  Extremities: warm, well-perfused, pulses palpable, no LE edema  Rectal/GU Deferred  Neuro: Grossly non-focal and symmetrical throughout  Skin: Clean and dry, no rashes, no breakdown    Diagnostic Tests:   TRANSESOPHOGEAL ECHO REPORT  Patient Name: Tony [name] Date of Exam: 01/29/2019  Medical Rec #: [id number] Height: 70.0 in  Accession #: [id number] Weight: 160.6 lb  Date of Birth: 07-Dec-1945 BSA: 1.90 m  Patient Age: 63 years BP: 160/63 mmHg  Patient Gender: M HR: 93 bpm.  Exam Location: Inpatient  Procedure: Transesophageal Echo, Color Doppler and Cardiac Doppler  Indications: Prosthetic Valve Aortic Stenosis  History: Patient has prior history of Echocardiogram examinations, most  recent 09/24/2017.  Sonographer: Raquel Sarna Senior RDCS  Referring Phys: [place]  Diagnosing Phys: Kirk Ruths MD  PROCEDURE: Patients was under conscious sedation during this procedure. Anesthetic was administered intravenously by performing Physician: 12mcg of Fentanyl, 6mg  of Versed. The transesophogeal probe was passed through the esophogus of the patient. The  patient developed no complications during the procedure.  IMPRESSIONS  1. Left ventricular ejection fraction, by visual estimation, is 55 to 60%. The left ventricle has normal function. There is mildly increased left ventricular hypertrophy.  2. Global right ventricle has normal systolic function.The right  ventricular size is normal.  3. Left atrial size was moderately dilated.  4. Right atrial size was normal.  5. The mitral valve is grossly normal. Moderate mitral valve regurgitation.  6. The tricuspid  valve is normal in structure. Tricuspid valve regurgitation is trivial.  7. Aortic valve regurgitation is moderate. Severe aortic valve stenosis.  8. The pulmonic valve was not well visualized. Pulmonic valve regurgitation is not visualized.  9. Moderate plaque invoving the descending aorta.  10. Normal LV function; s/p AVR with severe AS (mean gradient 51 mmg) and moderate AI; there is an echolucent space contiguous to prosthetic aortic valve with flow; ? prior abscess cavity; moderate MR; trace TR.  FINDINGS  Left Ventricle: Left ventricular ejection fraction, by visual estimation, is 55 to 60%. The left ventricle has normal function. There is mildly increased left ventricular hypertrophy.  Right Ventricle: The right ventricular size is normal. Global RV systolic function is has normal systolic function.  Left Atrium: Left atrial size was moderately dilated.  Right Atrium: Right atrial size was normal in size  Pericardium: There is no evidence of pericardial effusion.  Mitral Valve: The mitral valve is grossly normal. Moderate mitral valve regurgitation.  Tricuspid Valve: The tricuspid valve is normal in structure. Tricuspid valve regurgitation is trivial.  Aortic Valve: The aortic valve has been repaired/replaced. Aortic valve regurgitation is moderate. Severe aortic stenosis is present. Aortic valve mean gradient measures 51.0 mmHg. Aortic valve peak gradient measures 78.1 mmHg.  Pulmonic Valve: The pulmonic valve was not well visualized. Pulmonic valve regurgitation is not visualized.  Aorta: The aortic root is normal in size and structure. There is moderate plaque involving the descending aorta.  Shunts: No atrial level shunt detected by color flow Doppler.  AORTIC VALVE Normals  AV Vmax:  442.00 cm/s  AV Vmean: 342.000 cm/s 77 cm/s  AV VTI: 1.110 m 3.15 cm2  AV Peak Grad: 78.1 mmHg  AV Mean Grad: 51.0 mmHg 3 mmHg  Kirk Ruths MD  Electronically signed by Kirk Ruths MD  Signature Date/Time: 01/29/2019/9:16:18 AM    Physicians  Panel Physicians Referring Physician Case Authorizing Physician  Lorretta Harp, MD (Primary)       Procedures  RIGHT/LEFT HEART CATH AND CORONARY ANGIOGRAPHY     Conclusion  Hemodynamic findings consistent with aortic valve stenosis. KAVAN KIERAN is a 74 y.o. male  [id number]  LOCATION: FACILITY: [facility]  PHYSICIAN: Quay Burow, M.D.  1946-02-10  DATE OF PROCEDURE: 01/29/2019  DATE OF DISCHARGE:   CARDIAC CATHETERIZATION  History obtained from chart review.JORREL CRIPE is a 74 y.o. married Caucasian male father of 2 children, grandfather of 3 grandchildren referred by Dr. Claudie Leach for TEE, right left heart cath because of progressive prosthetic aortic valves stenosis and associated symptoms of dyspnea and chest pressure. 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 Mr. Factors include tobacco abuse currently smoking 2 cigars a week previously up to 8-10. He has treated hypertension, diabetes and hyperlipidemia. He is never had a heart attack or stroke. His brother did have bicuspid aortic stenosis and AVR at age 13. 2D echo performed by Dr. Claudie Leach revealed preserved LV function with a worsening aortic valve gradient. He presents today for transesophageal echo performed this morning by Dr. Stanford Breed, right and left heart cath to define his anatomy and physiology.  IMPRESSION: Mr. Lacinda Axon has preserved LV function, severe aortic stenosis and a prosthetic valve with moderate aortic insufficiency, and normal coronary arteries with a left dominant system. He will need aortic valve replacement. It is unclear whether this can be done using TAVR. The sheath was removed and a TR band was placed  on  the right wrist to achieve patent hemostasis. The patient left the lab in stable condition. He will be discharged home later today as an outpatient and will follow up with me next week. I discussed the results with the referring cardiologist, Dr. Claudie Leach.  Quay Burow. MD, [facility]  01/29/2019  4:29 PM      Recommendations  Antiplatelet/Anticoag Recommend Aspirin 81mg  daily for moderate CAD.        Indications  Severe aortic stenosis [I35.0 (ICD-10-CM)]     Procedural Details  Technical Details PROCEDURE DESCRIPTION:   The patient was brought to the second floor [facility] cath lab in the postabsorptive state. He was premedicated with IV Versed and fentanyl. His right wrist and antecubital fossaWere prepped and shaved in usual sterile fashion. Xylocaine 1% was used for local anesthesia. A 6 French sheath was inserted into the right radial artery using standard Seldinger technique. The patient received 3500 units of heparin intravenously. A 5 French sheath was inserted into the right antecubital vein. A 5 French balloontipped Swan-Ganz catheter was advanced through the right heart chambers obtaining sequential pressures and blood samples for the determination of Fick cardiac output. A 5 [place] TIG catheter was used for coronary angiography. On the pigtail was used for the entirety of the case. Retroaortic pressures monitored in the case. The patient received radial cocktail via the SideArm sheath.  Estimated blood loss <50 mL.   During this procedure medications were administered to achieve and maintain moderate conscious sedation while the patient's heart rate, blood pressure, and oxygen saturation were continuously monitored and I was present face-to-face 100% of this time.     Medications  (Filter: Administrations occurring from 01/29/19 1525 to 01/29/19 1625)  Continuous medications are totaled by the amount administered until 01/29/19 1625.  midazolam (VERSED) injection (mg)   Total dose: 0.5 mg  Date/Time   Rate/Dose/Volume Action  01/29/19 1539  0.5 mg Given  fentaNYL (SUBLIMAZE) injection (mcg)  Total dose: 25 mcg  Date/Time   Rate/Dose/Volume Action  01/29/19 1539  25 mcg Given  lidocaine (PF) (XYLOCAINE) 1 % injection (mL)  Total volume: 4 mL  Date/Time   Rate/Dose/Volume Action  01/29/19 1552  2 mL Given  1558  2 mL Given  Radial Cocktail/Verapamil only (mL)  Total volume: 0 mL  Date/Time   Rate/Dose/Volume Action  01/29/19 1559  10 mL Canceled Entry  Radial Cocktail (Verapamil 2.5 mg, NTG, Lidocaine) (mL)  Total volume: 5 mL  Date/Time   Rate/Dose/Volume Action  01/29/19 1600  5 mL Given  heparin injection (Units)  Total dose: 3,500 Units  Date/Time   Rate/Dose/Volume Action  01/29/19 1601  3,500 Units Given  iohexol (OMNIPAQUE) 350 MG/ML injection (mL)  Total volume: 55 mL  Date/Time   Rate/Dose/Volume Action  01/29/19 1611  55 mL Given  Heparin (Porcine) in NaCl 1000-0.9 UT/500ML-% SOLN (mL)  Total volume: 1,500 mL  Date/Time   Rate/Dose/Volume Action  01/29/19 1611  500 mL Given  1611  500 mL Given  1611  500 mL Given     Sedation Time  Sedation Time Physician-1: 27 minutes 30 seconds        Contrast  Medication Name Total Dose  iohexol (OMNIPAQUE) 350 MG/ML injection 55 mL     Radiation/Fluoro  Fluoro time: 5.4 (min)  DAP: 15302 (mGycm2)  Cumulative Air Kerma: 226 (mGy)        Coronary Findings  Diagnostic  Dominance: Left  No diagnostic findings have been documented.  Intervention  No interventions have been documented.          Right Heart  Right Heart Pressures Hemodynamic findings consistent with aortic valve stenosis. 1: Right atrial pressure: 4/4 2: Right ventricular pressure-37/4 3: Pulmonary artery pressure-39/24, mean 31 4: Pulmonary wedge pressure-17/17, mean 17 Cardiac output-4.2 L/min with an index of 2.2 L/min/m                    Coronary Diagrams  Diagnostic   Dominance: Left  &&&&&  Intervention       Implants  No implant documentation for this case.      Syngo Images Link to Procedure Log  Show images for CARDIAC CATHETERIZATION Procedure Log     Images on Long Term Storage   Show images for Gleason, [place] Data    Most Recent Value  Fick Cardiac Output 4.16 L/min  Fick Cardiac Output Index 2.19 (L/min)/BSA  RA A Wave 4 mmHg  RA V Wave 4 mmHg  RA Mean 4 mmHg  RV Systolic Pressure 37 mmHg  RV Diastolic Pressure 4 mmHg  RV EDP 6 mmHg  PA Systolic Pressure 39 mmHg  PA Diastolic Pressure 24 mmHg  PA Mean 31 mmHg  PW A Wave 17 mmHg  PW V Wave 17 mmHg  PW Mean 17 mmHg  AO Systolic Pressure [id number] mmHg  AO Diastolic Pressure 57 mmHg  AO Mean 85 mmHg  QP/QS 1  TPVR Index 14.19 HRUI  TSVR Index 38.89 HRUI  PVR SVR Ratio 0.17  TPVR/TSVR Ratio 0.36   ADDENDUM REPORT: 03/24/2019 08:50  CLINICAL DATA: Prosthetic aortic valve stenosis and regurgitation  EXAM:  Cardiac TAVR CT  TECHNIQUE:  The patient was scanned on a Siemens Force [id number] slice scanner. A 120  kV retrospective scan was triggered in the descending thoracic aorta  at 111 HU's. Gantry rotation speed was 270 msecs and collimation was  .9 mm. No beta blockade or nitro were given. The 3D data set was  reconstructed in 5% intervals of the R-R cycle. Systolic and  diastolic phases were analyzed on a dedicated work station using  MPR, MIP and VRT modes. The patient received 156mL OMNIPAQUE IOHEXOL  350 MG/ML SOLN of contrast.  FINDINGS:  Aortic Valve: Bioprosthetic 23 mm St Jude Trifecta bovine  pericardial tissue valve with mildly thickened and mildly calcified  valve leaflets. The leaflet adjacent to the right coronary cusp and  left coronary cusp have severely restricted motion. Possible  deformity of the stents adjacent to left coronary cusp and right  coronary cusp, stents appear bent inward toward the valve leaflets.  Sewing ring perforation  between right coronary cusp and non coronary  cusp.  Virtual Basal Annulus Measurements:  Valve measures 20 x 20 mm  No significant LVOT calcifications.  For valve in valve TAVR, the annulus may be suitable for a 23 mm  Sapien S3 valve.  Aorta: Short segment common origin of the right brachiocephalic and  left common carotid arteries. Normal location for takeoff of left  subclavian artery. Moderate mixed atherosclerotic plaque in the  aortic arch and descending thoracic aorta. No evidence of  coarctation of the thoracic aorta.  Sinus of Valsalva measures 41 mm in maximal dimension.  Sinotubular Junction: 27 mm  Ascending Thoracic Aorta: 37 mm  Aortic Arch: 30 mm  Minimum diameter of the proximal descending thoracic aorta is 26 mm,  with maximum diameter of the  proximal descending thoracic aorta 31  mm.  Mid descending thoracic aorta: 26 mm  Coronary Artery Height above Annulus:  Left Main: 10 mm  Right Coronary: 10 mm  Prosthesis to LM distance 5.9 mm  Prosthesis to RCA distance 4.4 mm  Coronary Arteries: 3 vessel coronary artery calcifications.  IMPRESSION:  1. Bioprosthetic 23 mm St Jude Trifecta bovine pericardial tissue  valve with mildly thickened and mildly calcified valve leaflets. The  leaflet adjacent to the right coronary cusp and left coronary cusp  have restricted motion. Possible deformity of the stents adjacent to  left coronary cusp and right coronary cusp, stents appear bent  inward toward the valve leaflets.  2. Sewing ring perforation between right coronary cusp and non  coronary cusp. No evidence of annular abscess.  3. Valve measures 20 x 20 mm  4. Sinus of Valsalva measures 41 mm in maximal dimension. Mid  ascending aorta 37 mm, proximal descending aorta measure 53mm.  5. Adequate coronary distance from prosthesis for valve in valve  TAVR.  6. For valve in valve TAVR, the annulus may be suitable for a 23 mm  Sapien S3 valve. However, given perforation of  prosthetic valve  sewing ring, Structural Heart Team discussion recommended.  Electronically Signed  By: Cherlynn Kaiser  On: 03/24/2019 08:50      STS Risk Calculator (Isolated redo AVR):  Risk of Mortality:  1.749%  Renal Failure:  1.124%  Permanent Stroke:  1.459%  Prolonged Ventilation:  5.151%  DSW Infection:  0.130%  Reoperation:  4.804%  Morbidity or Mortality:  10.254%  Short Length of Stay:  48.453%  Long Length of Stay:  4.110%    Impression:   This 74 year old gentleman has severe bioprosthetic aortic valve stenosis and moderate aortic insufficiency following aortic valve replacement in [place] in 2015. I have personally reviewed his transesophageal echo, cardiac catheterization, and gated cardiac CTA studies. His echocardiogram shows a mean gradient of 51 mmHg across the prosthetic aortic valve suggesting severe aortic stenosis. There is at least moderate aortic insufficiency and there appears to be significant AI outside of the valve ring suggesting a perivalvular leak. His gated cardiac CTA also shows a lucent area outside the valve ring that may be a perivalvular leak. Left ventricular systolic function is normal with ejection fraction of 55 to 60% with mild LVH. Right ventricular systolic function is normal. There is moderate mitral regurgitation and trace tricuspid regurgitation. Cardiac catheterization shows normal coronary arteries with a left dominant system. I agree that aortic valve replacement is indicated in this patient with evidence of severe aortic stenosis and moderate to severe aortic insufficiency with progressive symptoms. He was discussed at our multidisciplinary heart valve meeting and the concern is that he likely has a perivalvular leak that may have been present from shortly after his initial surgery since an early postoperative echo had already shown a leak. Unfortunately we do not have the details of that. He also had an episode of bacteremia  and could have had prosthetic valve endocarditis although that was never diagnosed by TEE. He would be a candidate for valve in valve TAVR if he did not have a significant perivalvular leak. TAVR would not change this leak and would leave him with continued problems. I think the best option for him would be redo sternotomy and redo aortic valve replacement using a bioprosthetic valve. The only other option would be to try to plug the paravalvular leak percutaneously and if successful then proceed with TAVR.  I think redo surgical AVR would be the best option for him since his surgical risk is still relatively low. I discussed the operative procedure with the patient and family including alternatives, benefits and risks; including but not limited to bleeding, blood transfusion, infection, stroke, myocardial infarction, graft failure, heart block requiring a permanent pacemaker, organ dysfunction, and death.  Si Gaul understands and agrees to proceed.     Plan:   Redo sternotomy for redo aortic valve replacement using a bioprosthetic aortic valve.   Gaye Pollack, MD

## 2019-04-12 ENCOUNTER — Encounter (HOSPITAL_COMMUNITY): Payer: Self-pay | Admitting: Surgery

## 2019-04-13 ENCOUNTER — Encounter (HOSPITAL_COMMUNITY): Payer: Self-pay | Admitting: Surgery

## 2019-04-13 ENCOUNTER — Inpatient Hospital Stay (HOSPITAL_COMMUNITY): Payer: Medicare Other

## 2019-04-13 ENCOUNTER — Inpatient Hospital Stay (HOSPITAL_COMMUNITY): Payer: Medicare Other | Admitting: Physician Assistant

## 2019-04-13 ENCOUNTER — Inpatient Hospital Stay (HOSPITAL_COMMUNITY)
Admission: RE | Admit: 2019-04-13 | Discharge: 2019-04-18 | DRG: 220 | Disposition: A | Discharge status: Home / Self Care | Payer: Medicare Other | Attending: Surgery | Admitting: Surgery

## 2019-04-13 ENCOUNTER — Encounter (HOSPITAL_COMMUNITY)
Admission: RE | Disposition: A | Discharge status: Home / Self Care | Payer: Self-pay | Source: Home / Self Care | Attending: Surgery

## 2019-04-13 ENCOUNTER — Other Ambulatory Visit: Payer: Self-pay

## 2019-04-13 ENCOUNTER — Inpatient Hospital Stay (HOSPITAL_COMMUNITY): Payer: Medicare Other | Admitting: Certified Registered"

## 2019-04-13 DIAGNOSIS — I340 Nonrheumatic mitral (valve) insufficiency: Secondary | ICD-10-CM | POA: Diagnosis present

## 2019-04-13 DIAGNOSIS — Z7982 Long term (current) use of aspirin: Secondary | ICD-10-CM | POA: Diagnosis not present

## 2019-04-13 DIAGNOSIS — Y831 Surgical operation with implant of artificial internal device as the cause of abnormal reaction of the patient, or of later complication, without mention of misadventure at the time of the procedure: Secondary | ICD-10-CM | POA: Diagnosis present

## 2019-04-13 DIAGNOSIS — I10 Essential (primary) hypertension: Secondary | ICD-10-CM | POA: Diagnosis present

## 2019-04-13 DIAGNOSIS — N400 Enlarged prostate without lower urinary tract symptoms: Secondary | ICD-10-CM | POA: Diagnosis present

## 2019-04-13 DIAGNOSIS — Z885 Allergy status to narcotic agent status: Secondary | ICD-10-CM

## 2019-04-13 DIAGNOSIS — T82857A Stenosis of cardiac prosthetic devices, implants and grafts, initial encounter: Principal | ICD-10-CM | POA: Diagnosis present

## 2019-04-13 DIAGNOSIS — Z8551 Personal history of malignant neoplasm of bladder: Secondary | ICD-10-CM | POA: Diagnosis not present

## 2019-04-13 DIAGNOSIS — R319 Hematuria, unspecified: Secondary | ICD-10-CM | POA: Diagnosis not present

## 2019-04-13 DIAGNOSIS — Z01812 Encounter for preprocedural laboratory examination: Secondary | ICD-10-CM

## 2019-04-13 DIAGNOSIS — I351 Nonrheumatic aortic (valve) insufficiency: Secondary | ICD-10-CM

## 2019-04-13 DIAGNOSIS — E119 Type 2 diabetes mellitus without complications: Secondary | ICD-10-CM | POA: Diagnosis present

## 2019-04-13 DIAGNOSIS — Z794 Long term (current) use of insulin: Secondary | ICD-10-CM | POA: Diagnosis not present

## 2019-04-13 DIAGNOSIS — F17210 Nicotine dependence, cigarettes, uncomplicated: Secondary | ICD-10-CM | POA: Diagnosis present

## 2019-04-13 DIAGNOSIS — I350 Nonrheumatic aortic (valve) stenosis: Secondary | ICD-10-CM

## 2019-04-13 DIAGNOSIS — Z952 Presence of prosthetic heart valve: Secondary | ICD-10-CM

## 2019-04-13 DIAGNOSIS — D62 Acute posthemorrhagic anemia: Secondary | ICD-10-CM | POA: Diagnosis not present

## 2019-04-13 DIAGNOSIS — Z09 Encounter for follow-up examination after completed treatment for conditions other than malignant neoplasm: Secondary | ICD-10-CM

## 2019-04-13 HISTORY — PX: TEE WITHOUT CARDIOVERSION: SHX5443

## 2019-04-13 HISTORY — PX: AORTIC VALVE REPLACEMENT: SHX41

## 2019-04-13 LAB — POCT I-STAT, CHEM 8
BUN: 13 mg/dL (ref 8–23)
BUN: 11 mg/dL (ref 8–23)
BUN: 11 mg/dL (ref 8–23)
BUN: 10 mg/dL (ref 8–23)
BUN: 11 mg/dL (ref 8–23)
BUN: 9 mg/dL (ref 8–23)
BUN: 10 mg/dL (ref 8–23)
Calcium, Ion: 1.16 mmol/L (ref 1.15–1.40)
Calcium, Ion: 1.11 mmol/L — ABNORMAL LOW (ref 1.15–1.40)
Calcium, Ion: 1 mmol/L — ABNORMAL LOW (ref 1.15–1.40)
Calcium, Ion: 1.02 mmol/L — ABNORMAL LOW (ref 1.15–1.40)
Calcium, Ion: 1.03 mmol/L — ABNORMAL LOW (ref 1.15–1.40)
Calcium, Ion: 0.98 mmol/L — ABNORMAL LOW (ref 1.15–1.40)
Calcium, Ion: 1 mmol/L — ABNORMAL LOW (ref 1.15–1.40)
Chloride: 99 mmol/L (ref 98–111)
Chloride: 102 mmol/L (ref 98–111)
Chloride: 98 mmol/L (ref 98–111)
Chloride: 98 mmol/L (ref 98–111)
Chloride: 99 mmol/L (ref 98–111)
Chloride: 101 mmol/L (ref 98–111)
Chloride: 103 mmol/L (ref 98–111)
Creatinine, Ser: 0.7 mg/dL (ref 0.61–1.24)
Creatinine, Ser: 0.7 mg/dL (ref 0.61–1.24)
Creatinine, Ser: 0.6 mg/dL — ABNORMAL LOW (ref 0.61–1.24)
Creatinine, Ser: 0.6 mg/dL — ABNORMAL LOW (ref 0.61–1.24)
Creatinine, Ser: 0.7 mg/dL (ref 0.61–1.24)
Creatinine, Ser: 0.6 mg/dL — ABNORMAL LOW (ref 0.61–1.24)
Creatinine, Ser: 0.6 mg/dL — ABNORMAL LOW (ref 0.61–1.24)
Glucose, Bld: 178 mg/dL — ABNORMAL HIGH (ref 70–99)
Glucose, Bld: 133 mg/dL — ABNORMAL HIGH (ref 70–99)
Glucose, Bld: 110 mg/dL — ABNORMAL HIGH (ref 70–99)
Glucose, Bld: 102 mg/dL — ABNORMAL HIGH (ref 70–99)
Glucose, Bld: 99 mg/dL (ref 70–99)
Glucose, Bld: 102 mg/dL — ABNORMAL HIGH (ref 70–99)
Glucose, Bld: 148 mg/dL — ABNORMAL HIGH (ref 70–99)
HCT: 37 % — ABNORMAL LOW (ref 39.0–52.0)
HCT: 35 % — ABNORMAL LOW (ref 39.0–52.0)
HCT: 30 % — ABNORMAL LOW (ref 39.0–52.0)
HCT: 26 % — ABNORMAL LOW (ref 39.0–52.0)
HCT: 29 % — ABNORMAL LOW (ref 39.0–52.0)
HCT: 26 % — ABNORMAL LOW (ref 39.0–52.0)
HCT: 31 % — ABNORMAL LOW (ref 39.0–52.0)
Hemoglobin: 12.6 g/dL — ABNORMAL LOW (ref 13.0–17.0)
Hemoglobin: 11.9 g/dL — ABNORMAL LOW (ref 13.0–17.0)
Hemoglobin: 10.2 g/dL — ABNORMAL LOW (ref 13.0–17.0)
Hemoglobin: 8.8 g/dL — ABNORMAL LOW (ref 13.0–17.0)
Hemoglobin: 9.9 g/dL — ABNORMAL LOW (ref 13.0–17.0)
Hemoglobin: 8.8 g/dL — ABNORMAL LOW (ref 13.0–17.0)
Hemoglobin: 10.5 g/dL — ABNORMAL LOW (ref 13.0–17.0)
Potassium: 3.7 mmol/L (ref 3.5–5.1)
Potassium: 3.6 mmol/L (ref 3.5–5.1)
Potassium: 3.7 mmol/L (ref 3.5–5.1)
Potassium: 4.1 mmol/L (ref 3.5–5.1)
Potassium: 4.3 mmol/L (ref 3.5–5.1)
Potassium: 4 mmol/L (ref 3.5–5.1)
Potassium: 4.1 mmol/L (ref 3.5–5.1)
Sodium: 138 mmol/L (ref 135–145)
Sodium: 137 mmol/L (ref 135–145)
Sodium: 139 mmol/L (ref 135–145)
Sodium: 136 mmol/L (ref 135–145)
Sodium: 138 mmol/L (ref 135–145)
Sodium: 138 mmol/L (ref 135–145)
Sodium: 139 mmol/L (ref 135–145)
TCO2: 25 mmol/L (ref 22–32)
TCO2: 26 mmol/L (ref 22–32)
TCO2: 28 mmol/L (ref 22–32)
TCO2: 28 mmol/L (ref 22–32)
TCO2: 29 mmol/L (ref 22–32)
TCO2: 27 mmol/L (ref 22–32)
TCO2: 24 mmol/L (ref 22–32)

## 2019-04-13 LAB — POCT I-STAT 7, (LYTES, BLD GAS, ICA,H+H)
Acid-Base Excess: 3 mmol/L — ABNORMAL HIGH (ref 0.0–2.0)
Acid-base deficit: 2 mmol/L (ref 0.0–2.0)
Acid-base deficit: 1 mmol/L (ref 0.0–2.0)
Acid-base deficit: 4 mmol/L — ABNORMAL HIGH (ref 0.0–2.0)
Bicarbonate: 28.2 mmol/L — ABNORMAL HIGH (ref 20.0–28.0)
Bicarbonate: 23.4 mmol/L (ref 20.0–28.0)
Bicarbonate: 23.5 mmol/L (ref 20.0–28.0)
Bicarbonate: 21.6 mmol/L (ref 20.0–28.0)
Calcium, Ion: 1.02 mmol/L — ABNORMAL LOW (ref 1.15–1.40)
Calcium, Ion: 1 mmol/L — ABNORMAL LOW (ref 1.15–1.40)
Calcium, Ion: 0.95 mmol/L — ABNORMAL LOW (ref 1.15–1.40)
Calcium, Ion: 1.04 mmol/L — ABNORMAL LOW (ref 1.15–1.40)
HCT: 30 % — ABNORMAL LOW (ref 39.0–52.0)
HCT: 32 % — ABNORMAL LOW (ref 39.0–52.0)
HCT: 37 % — ABNORMAL LOW (ref 39.0–52.0)
HCT: 35 % — ABNORMAL LOW (ref 39.0–52.0)
Hemoglobin: 10.2 g/dL — ABNORMAL LOW (ref 13.0–17.0)
Hemoglobin: 10.9 g/dL — ABNORMAL LOW (ref 13.0–17.0)
Hemoglobin: 12.6 g/dL — ABNORMAL LOW (ref 13.0–17.0)
Hemoglobin: 11.9 g/dL — ABNORMAL LOW (ref 13.0–17.0)
O2 Saturation: 100 %
O2 Saturation: 94 %
O2 Saturation: 92 %
O2 Saturation: 97 %
Patient temperature: 36.9
Patient temperature: 37.2
Potassium: 3.7 mmol/L (ref 3.5–5.1)
Potassium: 4.1 mmol/L (ref 3.5–5.1)
Potassium: 5 mmol/L (ref 3.5–5.1)
Potassium: 4.1 mmol/L (ref 3.5–5.1)
Sodium: 139 mmol/L (ref 135–145)
Sodium: 140 mmol/L (ref 135–145)
Sodium: 139 mmol/L (ref 135–145)
Sodium: 141 mmol/L (ref 135–145)
TCO2: 30 mmol/L (ref 22–32)
TCO2: 25 mmol/L (ref 22–32)
TCO2: 25 mmol/L (ref 22–32)
TCO2: 23 mmol/L (ref 22–32)
pCO2 arterial: 46.1 mmHg (ref 32.0–48.0)
pCO2 arterial: 39 mmHg (ref 32.0–48.0)
pCO2 arterial: 37.3 mmHg (ref 32.0–48.0)
pCO2 arterial: 40.1 mmHg (ref 32.0–48.0)
pH, Arterial: 7.395 (ref 7.350–7.450)
pH, Arterial: 7.385 (ref 7.350–7.450)
pH, Arterial: 7.407 (ref 7.350–7.450)
pH, Arterial: 7.339 — ABNORMAL LOW (ref 7.350–7.450)
pO2, Arterial: 400 mmHg — ABNORMAL HIGH (ref 83.0–108.0)
pO2, Arterial: 70 mmHg — ABNORMAL LOW (ref 83.0–108.0)
pO2, Arterial: 62 mmHg — ABNORMAL LOW (ref 83.0–108.0)
pO2, Arterial: 97 mmHg (ref 83.0–108.0)

## 2019-04-13 LAB — CBC
HCT: 37 % — ABNORMAL LOW (ref 39.0–52.0)
HCT: 34.1 % — ABNORMAL LOW (ref 39.0–52.0)
Hemoglobin: 12 g/dL — ABNORMAL LOW (ref 13.0–17.0)
Hemoglobin: 11 g/dL — ABNORMAL LOW (ref 13.0–17.0)
MCH: 31.3 pg (ref 26.0–34.0)
MCH: 31.3 pg (ref 26.0–34.0)
MCHC: 32.4 g/dL (ref 30.0–36.0)
MCHC: 32.3 g/dL (ref 30.0–36.0)
MCV: 96.4 fL (ref 80.0–100.0)
MCV: 96.9 fL (ref 80.0–100.0)
Platelets: 148 10*3/uL — ABNORMAL LOW (ref 150–400)
Platelets: 143 10*3/uL — ABNORMAL LOW (ref 150–400)
RBC: 3.84 MIL/uL — ABNORMAL LOW (ref 4.22–5.81)
RBC: 3.52 MIL/uL — ABNORMAL LOW (ref 4.22–5.81)
RDW: 13.6 % (ref 11.5–15.5)
RDW: 13.5 % (ref 11.5–15.5)
WBC: 14.8 10*3/uL — ABNORMAL HIGH (ref 4.0–10.5)
WBC: 16.4 10*3/uL — ABNORMAL HIGH (ref 4.0–10.5)
nRBC: 0 % (ref 0.0–0.2)
nRBC: 0 % (ref 0.0–0.2)

## 2019-04-13 LAB — PROTIME-INR
INR: 1.5 — ABNORMAL HIGH (ref 0.8–1.2)
Prothrombin Time: 18.3 seconds — ABNORMAL HIGH (ref 11.4–15.2)

## 2019-04-13 LAB — GLUCOSE, CAPILLARY
Glucose-Capillary: 166 mg/dL — ABNORMAL HIGH (ref 70–99)
Glucose-Capillary: 145 mg/dL — ABNORMAL HIGH (ref 70–99)
Glucose-Capillary: 114 mg/dL — ABNORMAL HIGH (ref 70–99)
Glucose-Capillary: 135 mg/dL — ABNORMAL HIGH (ref 70–99)
Glucose-Capillary: 118 mg/dL — ABNORMAL HIGH (ref 70–99)
Glucose-Capillary: 119 mg/dL — ABNORMAL HIGH (ref 70–99)
Glucose-Capillary: 125 mg/dL — ABNORMAL HIGH (ref 70–99)
Glucose-Capillary: 134 mg/dL — ABNORMAL HIGH (ref 70–99)
Glucose-Capillary: 134 mg/dL — ABNORMAL HIGH (ref 70–99)
Glucose-Capillary: 175 mg/dL — ABNORMAL HIGH (ref 70–99)

## 2019-04-13 LAB — PREPARE RBC (CROSSMATCH)

## 2019-04-13 LAB — HEMOGLOBIN AND HEMATOCRIT, BLOOD
HCT: 27.1 % — ABNORMAL LOW (ref 39.0–52.0)
Hemoglobin: 8.9 g/dL — ABNORMAL LOW (ref 13.0–17.0)

## 2019-04-13 LAB — ECHO INTRAOPERATIVE TEE
Height: 70 in
Weight: 2550.99 oz

## 2019-04-13 LAB — BASIC METABOLIC PANEL
Anion gap: 9 (ref 5–15)
BUN: 8 mg/dL (ref 8–23)
CO2: 22 mmol/L (ref 22–32)
Calcium: 6.9 mg/dL — ABNORMAL LOW (ref 8.9–10.3)
Chloride: 107 mmol/L (ref 98–111)
Creatinine, Ser: 0.72 mg/dL (ref 0.61–1.24)
GFR calc Af Amer: >60 mL/min (ref >60)
GFR calc non Af Amer: >60 mL/min (ref >60)
Glucose, Bld: 117 mg/dL — ABNORMAL HIGH (ref 70–99)
Potassium: 4.2 mmol/L (ref 3.5–5.1)
Sodium: 138 mmol/L (ref 135–145)

## 2019-04-13 LAB — PLATELET COUNT: Platelets: 97 10*3/uL — ABNORMAL LOW (ref 150–400)

## 2019-04-13 LAB — APTT: aPTT: 31 seconds (ref 24–36)

## 2019-04-13 LAB — MAGNESIUM: Magnesium: 2.6 mg/dL — ABNORMAL HIGH (ref 1.7–2.4)

## 2019-04-13 LAB — FIBRINOGEN: Fibrinogen: 199 mg/dL — ABNORMAL LOW (ref 210–475)

## 2019-04-13 SURGERY — REDO AORTIC VALVE REPLACEMENT (AVR)
Anesthesia: General | Site: Chest

## 2019-04-13 MED ORDER — DOPAMINE-DEXTROSE 3.2-5 MG/ML-% IV SOLN
3.0000 ug/kg/min | INTRAVENOUS | Status: DC
  Administered 2019-04-13: 3 ug/kg/min via INTRAVENOUS

## 2019-04-13 MED ORDER — PHENYLEPHRINE 40 MCG/ML (10ML) SYRINGE FOR IV PUSH (FOR BLOOD PRESSURE SUPPORT)
PREFILLED_SYRINGE | INTRAVENOUS | Status: AC
  Filled 2019-04-13: qty 10

## 2019-04-13 MED ORDER — PHENYLEPHRINE HCL-NACL 20-0.9 MG/250ML-% IV SOLN
0.0000 ug/min | INTRAVENOUS | Status: DC
  Administered 2019-04-13: 20 ug/min via INTRAVENOUS
  Administered 2019-04-14: 35 ug/min via INTRAVENOUS
  Filled 2019-04-13: qty 250

## 2019-04-13 MED ORDER — ACETAMINOPHEN 500 MG PO TABS
1000.0000 mg | ORAL_TABLET | Freq: Four times a day (QID) | ORAL | Status: DC
  Administered 2019-04-14 – 2019-04-15 (×6): 1000 mg via ORAL
  Filled 2019-04-13 (×6): qty 2

## 2019-04-13 MED ORDER — FAMOTIDINE IN NACL 20-0.9 MG/50ML-% IV SOLN
20.0000 mg | Freq: Two times a day (BID) | INTRAVENOUS | Status: DC
  Administered 2019-04-13: 16:00:00 20 mg via INTRAVENOUS

## 2019-04-13 MED ORDER — METOPROLOL TARTRATE 12.5 MG HALF TABLET: 12.5000 mg | ORAL_TABLET | Freq: Once | ORAL | Status: DC

## 2019-04-13 MED ORDER — SODIUM CHLORIDE 0.9% FLUSH
3.0000 mL | Freq: Two times a day (BID) | INTRAVENOUS | Status: DC
  Administered 2019-04-14 – 2019-04-15 (×3): 3 mL via INTRAVENOUS

## 2019-04-13 MED ORDER — PROTAMINE SULFATE 10 MG/ML IV SOLN
INTRAVENOUS | Status: DC | PRN
  Administered 2019-04-13: 250 mg via INTRAVENOUS

## 2019-04-13 MED ORDER — DOCUSATE SODIUM 100 MG PO CAPS
200.0000 mg | ORAL_CAPSULE | Freq: Every day | ORAL | Status: DC
  Administered 2019-04-14 – 2019-04-15 (×2): 200 mg via ORAL
  Filled 2019-04-13 (×2): qty 2

## 2019-04-13 MED ORDER — TRAMADOL HCL 50 MG PO TABS
50.0000 mg | ORAL_TABLET | ORAL | Status: DC | PRN
  Administered 2019-04-14: 03:00:00 100 mg via ORAL
  Filled 2019-04-13: qty 2

## 2019-04-13 MED ORDER — METOPROLOL TARTRATE 12.5 MG HALF TABLET
ORAL_TABLET | ORAL | Status: AC
  Filled 2019-04-13: qty 1

## 2019-04-13 MED ORDER — OXYCODONE HCL 5 MG PO TABS
5.0000 mg | ORAL_TABLET | ORAL | Status: DC | PRN
  Administered 2019-04-14: 22:00:00 10 mg via ORAL
  Administered 2019-04-15 (×2): 5 mg via ORAL
  Filled 2019-04-13 (×2): qty 1
  Filled 2019-04-13: qty 2

## 2019-04-13 MED ORDER — ACETAMINOPHEN 650 MG RE SUPP
650.0000 mg | Freq: Once | RECTAL | Status: AC
  Administered 2019-04-13: 650 mg via RECTAL

## 2019-04-13 MED ORDER — ALBUMIN HUMAN 5 % IV SOLN
250.0000 mL | INTRAVENOUS | Status: AC | PRN
  Administered 2019-04-13 – 2019-04-14 (×4): 12.5 g via INTRAVENOUS

## 2019-04-13 MED ORDER — PROPOFOL 10 MG/ML IV BOLUS
INTRAVENOUS | Status: AC
  Filled 2019-04-13: qty 20

## 2019-04-13 MED ORDER — SODIUM CHLORIDE 0.9% IV SOLUTION: Freq: Once | INTRAVENOUS | Status: DC

## 2019-04-13 MED ORDER — ASPIRIN 81 MG PO CHEW: 324.0000 mg | CHEWABLE_TABLET | Freq: Every day | ORAL | Status: DC

## 2019-04-13 MED ORDER — LACTATED RINGERS IV SOLN: INTRAVENOUS | Status: DC | PRN

## 2019-04-13 MED ORDER — CHLORHEXIDINE GLUCONATE 0.12% ORAL RINSE (MEDLINE KIT)
15.0000 mL | Freq: Two times a day (BID) | OROMUCOSAL | Status: DC
  Administered 2019-04-13 – 2019-04-17 (×5): 15 mL via OROMUCOSAL

## 2019-04-13 MED ORDER — FENTANYL CITRATE (PF) 250 MCG/5ML IJ SOLN
INTRAMUSCULAR | Status: AC
  Filled 2019-04-13: qty 25

## 2019-04-13 MED ORDER — MIDAZOLAM HCL (PF) 10 MG/2ML IJ SOLN
INTRAMUSCULAR | Status: AC
  Filled 2019-04-13: qty 2

## 2019-04-13 MED ORDER — PROTAMINE SULFATE 10 MG/ML IV SOLN
INTRAVENOUS | Status: AC
  Filled 2019-04-13: qty 25

## 2019-04-13 MED ORDER — THROMBIN 20000 UNITS EX SOLR
CUTANEOUS | Status: DC | PRN
  Administered 2019-04-13: 20000 [IU] via TOPICAL

## 2019-04-13 MED ORDER — HEPARIN SODIUM (PORCINE) 1000 UNIT/ML IJ SOLN
INTRAMUSCULAR | Status: AC
  Filled 2019-04-13: qty 1

## 2019-04-13 MED ORDER — PANTOPRAZOLE SODIUM 40 MG PO TBEC
40.0000 mg | DELAYED_RELEASE_TABLET | Freq: Every day | ORAL | Status: DC
  Administered 2019-04-15 – 2019-04-18 (×4): 40 mg via ORAL
  Filled 2019-04-13 (×4): qty 1

## 2019-04-13 MED ORDER — ESCITALOPRAM OXALATE 20 MG PO TABS
20.0000 mg | ORAL_TABLET | Freq: Every day | ORAL | Status: DC
  Administered 2019-04-14 – 2019-04-18 (×5): 20 mg via ORAL
  Filled 2019-04-13: qty 2
  Filled 2019-04-13 (×4): qty 1

## 2019-04-13 MED ORDER — CHLORHEXIDINE GLUCONATE 0.12 % MT SOLN: 15.0000 mL | Freq: Once | OROMUCOSAL | Status: AC

## 2019-04-13 MED ORDER — MIDAZOLAM HCL 2 MG/2ML IJ SOLN: 2.0000 mg | INTRAMUSCULAR | Status: DC | PRN

## 2019-04-13 MED ORDER — LACTATED RINGERS IV SOLN: INTRAVENOUS | Status: DC

## 2019-04-13 MED ORDER — ASPIRIN EC 325 MG PO TBEC
325.0000 mg | DELAYED_RELEASE_TABLET | Freq: Every day | ORAL | Status: DC
  Administered 2019-04-14 – 2019-04-15 (×2): 325 mg via ORAL
  Filled 2019-04-13 (×2): qty 1

## 2019-04-13 MED ORDER — METOPROLOL TARTRATE 25 MG/10 ML ORAL SUSPENSION: 12.5000 mg | Freq: Two times a day (BID) | ORAL | Status: DC

## 2019-04-13 MED ORDER — SODIUM CHLORIDE 0.9 % IV SOLN: 250.0000 mL | INTRAVENOUS | Status: DC

## 2019-04-13 MED ORDER — SODIUM CHLORIDE 0.9% FLUSH: 3.0000 mL | INTRAVENOUS | Status: DC | PRN

## 2019-04-13 MED ORDER — DOPAMINE-DEXTROSE 1.6-5 MG/ML-% IV SOLN
INTRAVENOUS | Status: DC | PRN
  Administered 2019-04-13: 5 ug/kg/min via INTRAVENOUS

## 2019-04-13 MED ORDER — 0.9 % SODIUM CHLORIDE (POUR BTL) OPTIME
TOPICAL | Status: DC | PRN
  Administered 2019-04-13: 5000 mL

## 2019-04-13 MED ORDER — CHLORHEXIDINE GLUCONATE 0.12 % MT SOLN
15.0000 mL | OROMUCOSAL | Status: AC
  Administered 2019-04-13: 15 mL via OROMUCOSAL

## 2019-04-13 MED ORDER — SODIUM CHLORIDE 0.9 % IV SOLN: INTRAVENOUS | Status: DC

## 2019-04-13 MED ORDER — MORPHINE SULFATE (PF) 2 MG/ML IV SOLN
1.0000 mg | INTRAVENOUS | Status: DC | PRN
  Administered 2019-04-14 (×4): 2 mg via INTRAVENOUS
  Filled 2019-04-13 (×4): qty 1

## 2019-04-13 MED ORDER — ARTIFICIAL TEARS OPHTHALMIC OINT
TOPICAL_OINTMENT | OPHTHALMIC | Status: DC | PRN
  Administered 2019-04-13: 1 via OPHTHALMIC

## 2019-04-13 MED ORDER — PHENYLEPHRINE 40 MCG/ML (10ML) SYRINGE FOR IV PUSH (FOR BLOOD PRESSURE SUPPORT)
PREFILLED_SYRINGE | INTRAVENOUS | Status: DC | PRN
  Administered 2019-04-13: 40 ug via INTRAVENOUS
  Administered 2019-04-13: 80 ug via INTRAVENOUS
  Administered 2019-04-13 (×2): 40 ug via INTRAVENOUS
  Administered 2019-04-13: 120 ug via INTRAVENOUS
  Administered 2019-04-13: 80 ug via INTRAVENOUS
  Administered 2019-04-13: 160 ug via INTRAVENOUS
  Administered 2019-04-13 (×2): 40 ug via INTRAVENOUS

## 2019-04-13 MED ORDER — CHLORHEXIDINE GLUCONATE 0.12 % MT SOLN
OROMUCOSAL | Status: AC
  Administered 2019-04-13: 15 mL via OROMUCOSAL
  Filled 2019-04-13: qty 15

## 2019-04-13 MED ORDER — BISACODYL 5 MG PO TBEC
10.0000 mg | DELAYED_RELEASE_TABLET | Freq: Every day | ORAL | Status: DC
  Administered 2019-04-14 – 2019-04-15 (×2): 10 mg via ORAL
  Filled 2019-04-13 (×2): qty 2

## 2019-04-13 MED ORDER — BISACODYL 10 MG RE SUPP: 10.0000 mg | Freq: Every day | RECTAL | Status: DC

## 2019-04-13 MED ORDER — POTASSIUM CHLORIDE 10 MEQ/50ML IV SOLN: 10.0000 meq | INTRAVENOUS | Status: DC

## 2019-04-13 MED ORDER — VANCOMYCIN HCL IN DEXTROSE 1-5 GM/200ML-% IV SOLN
1000.0000 mg | Freq: Once | INTRAVENOUS | Status: AC
  Administered 2019-04-13: 22:00:00 1000 mg via INTRAVENOUS
  Filled 2019-04-13: qty 200

## 2019-04-13 MED ORDER — THROMBIN (RECOMBINANT) 20000 UNITS EX SOLR
CUTANEOUS | Status: AC
  Filled 2019-04-13: qty 20000

## 2019-04-13 MED ORDER — ROCURONIUM BROMIDE 10 MG/ML (PF) SYRINGE
PREFILLED_SYRINGE | INTRAVENOUS | Status: AC
  Filled 2019-04-13: qty 20

## 2019-04-13 MED ORDER — METOPROLOL TARTRATE 5 MG/5ML IV SOLN: 2.5000 mg | INTRAVENOUS | Status: DC | PRN

## 2019-04-13 MED ORDER — MIDAZOLAM HCL 5 MG/5ML IJ SOLN
INTRAMUSCULAR | Status: DC | PRN
  Administered 2019-04-13 (×3): 1 mg via INTRAVENOUS
  Administered 2019-04-13 (×2): 2 mg via INTRAVENOUS
  Administered 2019-04-13: 3 mg via INTRAVENOUS

## 2019-04-13 MED ORDER — INSULIN REGULAR(HUMAN) IN NACL 100-0.9 UT/100ML-% IV SOLN
INTRAVENOUS | Status: DC
  Administered 2019-04-13: 16:00:00 1.8 [IU]/h via INTRAVENOUS
  Filled 2019-04-13: qty 100

## 2019-04-13 MED ORDER — MAGNESIUM SULFATE 4 GM/100ML IV SOLN
4.0000 g | Freq: Once | INTRAVENOUS | Status: AC
  Administered 2019-04-13: 4 g via INTRAVENOUS
  Filled 2019-04-13: qty 100

## 2019-04-13 MED ORDER — CHLORHEXIDINE GLUCONATE CLOTH 2 % EX PADS
6.0000 | MEDICATED_PAD | Freq: Every day | CUTANEOUS | Status: DC
  Administered 2019-04-13 – 2019-04-15 (×3): 6 via TOPICAL

## 2019-04-13 MED ORDER — DEXMEDETOMIDINE HCL IN NACL 400 MCG/100ML IV SOLN
0.0000 ug/kg/h | INTRAVENOUS | Status: DC
  Administered 2019-04-13: 0.4 ug/kg/h via INTRAVENOUS

## 2019-04-13 MED ORDER — ONDANSETRON HCL 4 MG/2ML IJ SOLN: 4.0000 mg | Freq: Four times a day (QID) | INTRAMUSCULAR | Status: DC | PRN

## 2019-04-13 MED ORDER — DEXTROSE 50 % IV SOLN: 0.0000 mL | INTRAVENOUS | Status: DC | PRN

## 2019-04-13 MED ORDER — ACETAMINOPHEN 160 MG/5ML PO SOLN: 650.0000 mg | Freq: Once | ORAL | Status: AC

## 2019-04-13 MED ORDER — SODIUM CHLORIDE 0.9 % IV SOLN
1.5000 g | Freq: Two times a day (BID) | INTRAVENOUS | Status: DC
  Administered 2019-04-14 – 2019-04-15 (×3): 1.5 g via INTRAVENOUS
  Filled 2019-04-13 (×5): qty 1.5

## 2019-04-13 MED ORDER — ACETAMINOPHEN 160 MG/5ML PO SOLN: 1000.0000 mg | Freq: Four times a day (QID) | ORAL | Status: DC

## 2019-04-13 MED ORDER — FENTANYL CITRATE (PF) 250 MCG/5ML IJ SOLN
INTRAMUSCULAR | Status: DC | PRN
  Administered 2019-04-13: 50 ug via INTRAVENOUS
  Administered 2019-04-13: 900 ug via INTRAVENOUS
  Administered 2019-04-13: 50 ug via INTRAVENOUS
  Administered 2019-04-13 (×2): 100 ug via INTRAVENOUS
  Administered 2019-04-13: 50 ug via INTRAVENOUS

## 2019-04-13 MED ORDER — CHLORHEXIDINE GLUCONATE 4 % EX LIQD: 30.0000 mL | CUTANEOUS | Status: DC

## 2019-04-13 MED ORDER — LACTATED RINGERS IV SOLN: 500.0000 mL | Freq: Once | INTRAVENOUS | Status: DC | PRN

## 2019-04-13 MED ORDER — HEPARIN SODIUM (PORCINE) 1000 UNIT/ML IJ SOLN
INTRAMUSCULAR | Status: DC | PRN
  Administered 2019-04-13: 25000 [IU] via INTRAVENOUS

## 2019-04-13 MED ORDER — HEMOSTATIC AGENTS (NO CHARGE) OPTIME
TOPICAL | Status: DC | PRN
  Administered 2019-04-13 (×4): 1 via TOPICAL

## 2019-04-13 MED ORDER — NITROGLYCERIN IN D5W 200-5 MCG/ML-% IV SOLN: 0.0000 ug/min | INTRAVENOUS | Status: DC

## 2019-04-13 MED ORDER — ROCURONIUM BROMIDE 10 MG/ML (PF) SYRINGE
PREFILLED_SYRINGE | INTRAVENOUS | Status: DC | PRN
  Administered 2019-04-13 (×2): 50 mg via INTRAVENOUS
  Administered 2019-04-13: 100 mg via INTRAVENOUS
  Administered 2019-04-13: 50 mg via INTRAVENOUS

## 2019-04-13 MED ORDER — ORAL CARE MOUTH RINSE
15.0000 mL | OROMUCOSAL | Status: DC
  Administered 2019-04-13 – 2019-04-14 (×7): 15 mL via OROMUCOSAL

## 2019-04-13 MED ORDER — PROPOFOL 10 MG/ML IV BOLUS
INTRAVENOUS | Status: DC | PRN
  Administered 2019-04-13: 40 mg via INTRAVENOUS

## 2019-04-13 MED ORDER — SODIUM CHLORIDE 0.45 % IV SOLN: INTRAVENOUS | Status: DC | PRN

## 2019-04-13 MED ORDER — METOPROLOL TARTRATE 12.5 MG HALF TABLET: 12.5000 mg | ORAL_TABLET | Freq: Two times a day (BID) | ORAL | Status: DC

## 2019-04-13 SURGICAL SUPPLY — 90 items
ADAPTER CARDIO PERF ANTE/RETRO (ADAPTER) ×3 IMPLANT
BAG DECANTER FOR FLEXI CONT (MISCELLANEOUS) IMPLANT
BLADE CLIPPER SURG (BLADE) ×3 IMPLANT
BLADE CORE FAN STRYKER (BLADE) ×3 IMPLANT
BLADE MIC 41X13 (BLADE) IMPLANT
BLADE STERNUM SYSTEM 6 (BLADE) IMPLANT
BLADE SURG 15 STRL LF DISP TIS (BLADE) ×6 IMPLANT
BLADE SURG 15 STRL SS (BLADE) ×3
CANISTER SUCT 3000ML PPV (MISCELLANEOUS) ×3 IMPLANT
CANNULA GUNDRY RCSP 15FR (MISCELLANEOUS) ×3 IMPLANT
CANNULA SUMP PERICARDIAL (CANNULA) ×3 IMPLANT
CANNULA VRC MALB SNGL STG 32FR (MISCELLANEOUS) ×2 IMPLANT
CATH HEART VENT LEFT (CATHETERS) ×2 IMPLANT
CATH ROBINSON RED A/P 18FR (CATHETERS) ×9 IMPLANT
CATH THORACIC 28FR (CATHETERS) ×3 IMPLANT
CATH THORACIC 36FR (CATHETERS) ×3 IMPLANT
CATH THORACIC 36FR RT ANG (CATHETERS) ×3 IMPLANT
CONN 1/2X1/2X1/2  BEN (MISCELLANEOUS)
CONN 1/2X1/2X1/2 BEN (MISCELLANEOUS) IMPLANT
CONN 3/8X1/2 ST GISH (MISCELLANEOUS) ×3 IMPLANT
CONT SPEC 4OZ CLIKSEAL STRL BL (MISCELLANEOUS) ×3 IMPLANT
COVER SURGICAL LIGHT HANDLE (MISCELLANEOUS) IMPLANT
DRAPE CARDIOVASCULAR INCISE (DRAPES) ×1
DRAPE SLUSH/WARMER DISC (DRAPES) ×3 IMPLANT
DRAPE SRG 135X102X78XABS (DRAPES) ×2 IMPLANT
DRSG COVADERM 4X14 (GAUZE/BANDAGES/DRESSINGS) ×3 IMPLANT
ELECT CAUTERY BLADE 6.4 (BLADE) ×3 IMPLANT
ELECT REM PT RETURN 9FT ADLT (ELECTROSURGICAL) ×6
ELECTRODE REM PT RTRN 9FT ADLT (ELECTROSURGICAL) ×4 IMPLANT
FELT TEFLON 1X6 (MISCELLANEOUS) ×3 IMPLANT
GAUZE SPONGE 4X4 12PLY STRL (GAUZE/BANDAGES/DRESSINGS) ×3 IMPLANT
GAUZE SPONGE 4X4 12PLY STRL LF (GAUZE/BANDAGES/DRESSINGS) ×3 IMPLANT
GLOVE BIO SURGEON STRL SZ 6 (GLOVE) ×9 IMPLANT
GLOVE BIO SURGEON STRL SZ 6.5 (GLOVE) ×6 IMPLANT
GLOVE BIO SURGEON STRL SZ7.5 (GLOVE) ×3 IMPLANT
GLOVE BIOGEL PI IND STRL 6 (GLOVE) ×4 IMPLANT
GLOVE BIOGEL PI IND STRL 7.5 (GLOVE) ×4 IMPLANT
GLOVE BIOGEL PI INDICATOR 6 (GLOVE) ×2
GLOVE BIOGEL PI INDICATOR 7.5 (GLOVE) ×2
GLOVE SS BIOGEL STRL SZ 7 (GLOVE) ×4 IMPLANT
GLOVE SUPERSENSE BIOGEL SZ 7 (GLOVE) ×2
GOWN STRL REUS W/ TWL LRG LVL3 (GOWN DISPOSABLE) ×16 IMPLANT
GOWN STRL REUS W/ TWL XL LVL3 (GOWN DISPOSABLE) ×2 IMPLANT
GOWN STRL REUS W/TWL LRG LVL3 (GOWN DISPOSABLE) ×8
GOWN STRL REUS W/TWL XL LVL3 (GOWN DISPOSABLE) ×1
HEMOSTAT POWDER SURGIFOAM 1G (HEMOSTASIS) ×9 IMPLANT
HEMOSTAT SURGICEL 2X14 (HEMOSTASIS) ×3 IMPLANT
HEMOSTAT SURGICEL 4X8 (HEMOSTASIS) IMPLANT
KIT BASIN OR (CUSTOM PROCEDURE TRAY) ×3 IMPLANT
KIT CATH CPB BARTLE (MISCELLANEOUS) ×3 IMPLANT
KIT SUCTION CATH 14FR (SUCTIONS) ×3 IMPLANT
KIT TURNOVER KIT B (KITS) ×3 IMPLANT
LINE VENT (MISCELLANEOUS) ×3 IMPLANT
LOOP VESSEL SUPERMAXI WHITE (MISCELLANEOUS) IMPLANT
NS IRRIG 1000ML POUR BTL (IV SOLUTION) ×15 IMPLANT
PACK E OPEN HEART (SUTURE) ×3 IMPLANT
PACK OPEN HEART (CUSTOM PROCEDURE TRAY) ×3 IMPLANT
PAD ARMBOARD 7.5X6 YLW CONV (MISCELLANEOUS) ×6 IMPLANT
PAD DEFIB R2 (MISCELLANEOUS) ×3 IMPLANT
PAD ELECT DEFIB RADIOL ZOLL (MISCELLANEOUS) ×6 IMPLANT
POSITIONER HEAD DONUT 9IN (MISCELLANEOUS) ×3 IMPLANT
SET CARDIOPLEGIA MPS 5001102 (MISCELLANEOUS) ×3 IMPLANT
SOAP 2 % CHG 4 OZ (WOUND CARE) ×3 IMPLANT
SUT BONE WAX W31G (SUTURE) ×6 IMPLANT
SUT ETHIBON 2 0 V 52N 30 (SUTURE) ×6 IMPLANT
SUT ETHIBON EXCEL 2-0 V-5 (SUTURE) ×3 IMPLANT
SUT ETHIBOND 2 0 SH (SUTURE) ×1
SUT ETHIBOND 2 0 SH 36X2 (SUTURE) ×2 IMPLANT
SUT ETHIBOND V-5 VALVE (SUTURE) ×6 IMPLANT
SUT PROLENE 3 0 SH DA (SUTURE) ×3 IMPLANT
SUT PROLENE 3 0 SH1 36 (SUTURE) ×3 IMPLANT
SUT PROLENE 4 0 RB 1 (SUTURE) ×3
SUT PROLENE 4-0 RB1 .5 CRCL 36 (SUTURE) ×6 IMPLANT
SUT SILK  1 MH (SUTURE) ×2
SUT SILK 1 MH (SUTURE) ×4 IMPLANT
SUT STEEL 6MS V (SUTURE) IMPLANT
SUT VIC AB 1 CTX 36 (SUTURE) ×3
SUT VIC AB 1 CTX36XBRD ANBCTR (SUTURE) ×6 IMPLANT
SYSTEM SAHARA CHEST DRAIN ATS (WOUND CARE) ×3 IMPLANT
TAPE CLOTH SURG 4X10 WHT LF (GAUZE/BANDAGES/DRESSINGS) ×3 IMPLANT
TAPE PAPER 2X10 WHT MICROPORE (GAUZE/BANDAGES/DRESSINGS) ×3 IMPLANT
TOWEL GREEN STERILE (TOWEL DISPOSABLE) ×3 IMPLANT
TOWEL GREEN STERILE FF (TOWEL DISPOSABLE) IMPLANT
TRAY FOLEY SLVR 14FR TEMP STAT (SET/KITS/TRAYS/PACK) IMPLANT
TRAY FOLEY SLVR 16FR TEMP STAT (SET/KITS/TRAYS/PACK) ×3 IMPLANT
UNDERPAD 30X30 (UNDERPADS AND DIAPERS) ×3 IMPLANT
VALVE AORTIC SZ21 INSP/RESIL (Valve) ×3 IMPLANT
VENT LEFT HEART 12002 (CATHETERS) ×3
VRC MALLEABLE SINGLE STG 32FR (MISCELLANEOUS) ×3
WATER STERILE IRR 1000ML POUR (IV SOLUTION) ×6 IMPLANT

## 2019-04-13 NOTE — Brief Op Note (Signed)
04/13/2019  1:11 PM  PATIENT:  Tony [name]  74 y.o. male  PRE-OPERATIVE DIAGNOSIS:  SEVERE AORTIC STENOSIS  POST-OPERATIVE DIAGNOSIS:  SEVERE AORTIC STENOSIS  PROCEDURE:  Procedure(s): REDO AORTIC VALVE REPLACEMENT (AVR) using INSPIRIS 21 MM RESILIA AORTIC VALVE. (N/A) TRANSESOPHAGEAL ECHOCARDIOGRAM (TEE) (N/A)  SURGEON:  Surgeon(s) and Role:    * Bartle, Fernande Boyden, MD - Primary  PHYSICIAN ASSISTANT: Yazmen Briones PA-C   ANESTHESIA:   general  EBL:  1400 mL   BLOOD ADMINISTERED:1 UNIT PLTS  DRAINS: MEDIASTINAL CHEST DRAINS   LOCAL MEDICATIONS USED:  NONE  SPECIMEN:  Source of Specimen:  OLD AORTIC EXPLANT  DISPOSITION OF SPECIMEN:  PATHOLOGY  COUNTS:  YES  TOURNIQUET:  * No tourniquets in log *  DICTATION: .Dragon Dictation  PLAN OF CARE: Admit to inpatient   PATIENT DISPOSITION:  ICU - intubated and hemodynamically stable.   Delay start of Pharmacological VTE agent (>24hrs) due to surgical blood loss or risk of bleeding: yes  COMPLICATIONS: NO KNOWN

## 2019-04-13 NOTE — Interval H&P Note (Signed)
History and Physical Interval Note:  04/13/2019 7:05 AM  Tony [name]  has presented today for surgery, with the diagnosis of SEVERE AS.  The various methods of treatment have been discussed with the patient and family. After consideration of risks, benefits and other options for treatment, the patient has consented to  Procedure(s): REDO AORTIC VALVE REPLACEMENT (AVR) (N/A) TRANSESOPHAGEAL ECHOCARDIOGRAM (TEE) (N/A) as a surgical intervention.  The patient's history has been reviewed, patient examined, no change in status, stable for surgery.  I have reviewed the patient's chart and labs.  Questions were answered to the patient's satisfaction.     Gaye Pollack

## 2019-04-13 NOTE — Progress Notes (Addendum)
[facility] BRIEF SICU PROGRESS NOTE  Day of Surgery  S/P Procedure(s) (LRB): REDO AORTIC VALVE REPLACEMENT (AVR) using INSPIRIS 21 MM RESILIA AORTIC VALVE. (N/A) TRANSESOPHAGEAL ECHOCARDIOGRAM (TEE) (N/A)   Sedated on vent AAI paced w/ stable hemodynamics O2 sats 97% on 60% FiO2 Chest tube output low UOP adequate Labs okay  Plan: Continue routine early postop  Rexene Alberts, MD 04/13/2019 5:36 PM

## 2019-04-13 NOTE — Anesthesia Postprocedure Evaluation (Signed)
Anesthesia Post Note  Patient: Tony [name]  Procedure(s) Performed: REDO AORTIC VALVE REPLACEMENT (AVR) using INSPIRIS 21 MM RESILIA AORTIC VALVE. (N/A Chest) TRANSESOPHAGEAL ECHOCARDIOGRAM (TEE) (N/A )     Patient location during evaluation: PACU Anesthesia Type: General Level of consciousness: patient remains intubated per anesthesia plan Pain management: pain level controlled Vital Signs Assessment: post-procedure vital signs reviewed and stable Respiratory status: patient on ventilator - see flowsheet for VS Cardiovascular status: blood pressure returned to baseline and stable Anesthetic complications: no    Last Vitals:  Vitals:   04/13/19 0713 04/13/19 0714  BP:    Pulse: 92   Resp: 16 (!) 23  Temp:    SpO2: 96% 95%    Last Pain:  Vitals:   04/13/19 0611  TempSrc:   PainSc: 0-No pain                 Tamie Minteer DAVID

## 2019-04-13 NOTE — Anesthesia Procedure Notes (Signed)
Procedure Name: Intubation Date/Time: 04/13/2019 7:47 AM Performed by: Candis Shine, CRNA Pre-anesthesia Checklist: Patient identified, Emergency Drugs available, Suction available and Patient being monitored Patient Re-evaluated:Patient Re-evaluated prior to induction Oxygen Delivery Method: Circle System Utilized Preoxygenation: Pre-oxygenation with 100% oxygen Induction Type: IV induction Ventilation: Mask ventilation without difficulty and Oral airway inserted - appropriate to patient size Laryngoscope Size: Mac and 4 Grade View: Grade I Tube type: Oral Tube size: 8.0 mm Number of attempts: 1 Airway Equipment and Method: Stylet and Oral airway Placement Confirmation: ETT inserted through vocal cords under direct vision,  positive ETCO2 and breath sounds checked- equal and bilateral Secured at: 23 cm Tube secured with: Tape Dental Injury: Teeth and Oropharynx as per pre-operative assessment

## 2019-04-13 NOTE — Anesthesia Procedure Notes (Signed)
Central Venous Catheter Insertion Performed by: Oleta Mouse, MD, anesthesiologist Start/End2/15/2021 6:54 AM, 04/13/2019 7:05 AM Patient location: OR. Preanesthetic checklist: patient identified, IV checked, site marked, risks and benefits discussed, surgical consent, monitors and equipment checked, pre-op evaluation, timeout performed and anesthesia consent Lidocaine 1% used for infiltration and patient sedated Hand hygiene performed  and maximum sterile barriers used  Catheter size: 8.5 Fr Total catheter length 10. Sheath introducer Procedure performed using ultrasound guided technique. Ultrasound Notes:anatomy identified, needle tip was noted to be adjacent to the nerve/plexus identified, no ultrasound evidence of intravascular and/or intraneural injection and image(s) printed for medical record Attempts: 1 Following insertion, line sutured and dressing applied. Post procedure assessment: blood return through all ports, free fluid flow and no air  Patient tolerated the procedure well with no immediate complications.

## 2019-04-13 NOTE — Progress Notes (Signed)
  Echocardiogram Echocardiogram Transesophageal has been performed.  Tony [name] 04/13/2019, 8:31 AM

## 2019-04-13 NOTE — Op Note (Signed)
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   Postoperative Diagnosis:  Same   Procedure:  1. Redo Median Sternotomy 2. Extracorporeal circulation 3.   Redo Aortic valve replacement using a 21 mm Edwards INSPIRIS RESILIA pericardial valve.  Anesthesia:  General Endotracheal   Clinical History/Surgical Indication:  This 74 year old gentleman has severe bioprosthetic aortic valve stenosis and moderate aortic insufficiency following aortic valve replacement in [place] in 2015. I have personally reviewed his transesophageal echo, cardiac catheterization, and gated cardiac CTA studies. His echocardiogram shows a mean gradient of 51 mmHg across the prosthetic aortic valve suggesting severe aortic stenosis. There is at least moderate aortic insufficiency and there appears to be significant AI outside of the valve ring suggesting a perivalvular leak. His gated cardiac CTA also shows a lucent area outside the valve ring that may be a perivalvular leak. Left ventricular systolic function is normal with ejection fraction of 55 to 60% with mild LVH. Right ventricular systolic function is normal. There is moderate mitral regurgitation and trace tricuspid regurgitation. Cardiac catheterization shows normal coronary arteries with a left dominant system. I agree that aortic valve replacement is indicated in this patient with evidence of severe aortic stenosis and moderate to severe aortic insufficiency with progressive symptoms. He was discussed at our multidisciplinary heart valve meeting and the concern is that he likely has a perivalvular leak that may have been present from shortly after his initial surgery since an early postoperative echo had already shown a leak. Unfortunately we do not have the  details of that. 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 He would be a candidate for valve in valve TAVR if he did not have a significant perivalvular leak. TAVR would not change this leak and would leave him with continued problems. I think the best option for him would be redo sternotomy and redo aortic valve replacement using a bioprosthetic valve. The only other option would be to try to plug the paravalvular leak percutaneously and if successful then proceed with TAVR. I think redo surgical AVR would be the best option for him since his surgical risk is still relatively low. I discussed the operative procedure with the patient and family including alternatives, benefits and risks; including but not limited to bleeding, blood transfusion, infection, stroke, myocardial infarction, graft failure, heart block requiring a permanent pacemaker, organ dysfunction, and death.  Si Gaul understands and agrees to proceed.    Preparation:  The patient was seen in the preoperative holding area and the correct patient, correct operation were confirmed with the patient after reviewing the medical record and catheterization. The consent was signed by me. Preoperative antibiotics were given. A pulmonary arterial line and radial arterial line were placed by the anesthesia team. The patient was taken back to the operating room and positioned supine on the operating room table. After being placed under general endotracheal anesthesia by the anesthesia team a foley catheter was placed. The neck, chest, abdomen, and both legs were prepped with betadine soap and solution and draped in the usual sterile manner. A surgical time-out was taken and the correct patient and operative procedure were confirmed with the nursing and anesthesia staff.   Pre-bypass TEE:   Complete TEE assessment was performed by  Dr. Lillia Abed.  This showed  very calcified aortic valve prosthesis leaflet mobility.  Dr. Conrad [place] only measured a mean gradient across the aortic valve at 14 mmHg but was 54 mmHg on the TEE done on 01/29/2019.  I doubt that the measurement today is accurate given the appearance of the valve.  There was a least moderate aortic insufficiency.  Left ventricular ejection fraction was 55 to 60%.    Post-bypass TEE:   Normal functioning prosthetic aortic valve with no perivalvular leak and mild central regurgitation through the valve. The mean aortic valve gradient was 7 mm Hg. Left ventricular function was preserved.  Mild mitral regurgitation.    Cardiopulmonary Bypass:  A redo median sternotomy was performed.  Sternal wires were removed and the sternum opened using the oscillating saw without difficulty.  The sternal edges were retracted with bone hooks and the heart dissected from the back and sternum. Right ventricular function appeared normal.  Dissection was performed to expose the right atrium and ascending aorta.  There were very dense adhesions present.  The ascending aorta was mildly enlarged and had no palpable plaque. There were no contraindications to aortic cannulation or cross-clamping. The patient was fully systemically heparinized and the ACT was maintained > 400 sec. The proximal aortic arch was cannulated with a 20 F aortic cannula for arterial inflow. Venous cannulation was performed via the right atrial appendage using a single-staged 32 F venous cannula. An antegrade cardioplegia/vent cannula was inserted into the mid-ascending aorta. A left ventricular vent was placed via the right superior pulmonary vein. A retrograde cardioplegia cannnula was placed into the coronary sinus via the right atrium. Aortic occlusion was performed with a single cross-clamp. Systemic cooling to 32 degrees Centigrade and topical cooling of the heart with iced saline were used. Hyperkalemic retrograde cold blood cardioplegia was used to  induce diastolic arrest and then cold blood retrograde cardioplegia was given at about 20 minute intervals throughout the period of arrest to maintain myocardial temperature at or below 10 degrees centigrade. A temperature probe was inserted into the interventricular septum and an insulating pad was placed in the pericardium. Carbon dioxide was insufflated into the pericardium at 5L/min throughout the procedure to minimize intracardiac air.   Aortic Valve Replacement:   A transverse aortotomy was performed 1 cm above the take-off of the right coronary artery. The prosthetic aortic valve was heavily calcified with complete immobility of two of the leaflets which were calcified and did not coapt in the midline. The commissural posts were firmly adherent to the aortic wall and appeared distorted.  These were carefully separated. The annular area was examined and there was a paravalvular leak about 4 mm in diameter along the noncoronary annulus. The prosthetic valve was separated from the annulus by cutting the annular sutures and carefully separating the sewing ring from the annulus. This was very difficult due to the exposure. The ostia of the coronary arteries were in normal position and were not obstructed. There was some calcified plaque around the coronary ostia as well as in the sinuses.  Care was taken to remove all particulate debris. The left ventricle was directly inspected for debris and then irrigated with ice saline solution. The annulus was sized and a size 21 mm Edwards INSPIRIS RESILIA pericardial  valve was chosen. The model number was [id number] and the serial number was [id number]. The patient had a 23 mm valve inserted before but there was no way that a 23 mm valve was going to  fit. His BSA was only 1.89 so I thought a 21 mm valve would be sufficient. I considered a root replacement with a porcine root but there were dense adhesions between the PA and aortic root and calcification around the coronary  ostia which would make exposure and mobilization of the left coronary particularly hazardous. While the valve was being prepared 2-0 Ethibond pledgeted horizontal mattress sutures were placed around the annulus with the pledgets in a sub-annular position. This was difficult due to the exposure of the annulus within his root. The sutures were placed through the sewing ring and the valve lowered into place. The sutures were tied sequentially. The valve seated nicely and the coronary ostia were not obstructed. The prosthetic valve leaflets moved normally and there was no sub-valvular obstruction. The aortotomy was closed using 4-0 Prolene suture in 2 layers with felt strips to reinforce the closure.  Completion:  The patient was rewarmed to 37 degrees Centigrade. De-airing maneuvers were performed and the head placed in trendelenburg position. The crossclamp was removed with a time of 190 minutes. There was spontaneous return of sinus rhythm. The aortotomy was checked for hemostasis. Two temporary epicardial pacing wires were placed on the right atrium and two on the right ventricle. The left ventricular vent and retrograde cardioplegia cannulas were removed. The patient was weaned from CPB without difficulty on dopamine 3 mcg.  CPB time was 255 minutes. Cardiac output was 4 LPM. Heparin was fully reversed with protamine and the aortic and venous cannulas removed. Hemostasis was achieved. Mediastinal drainage tubes were placed. The sternum was closed with double #6 stainless steel wires. The fascia was closed with continuous # 1 vicryl suture. The subcutaneous tissue was closed with 2-0 vicryl continuous suture. The skin was closed with 3-0 vicryl subcuticular suture. All sponge, needle, and instrument counts were reported correct at the end of the case. Dry sterile dressings were placed over the incisions and around the chest tubes which were connected to pleurevac suction. The patient was then transported to the  surgical intensive care unit in stable condition.

## 2019-04-13 NOTE — Anesthesia Procedure Notes (Signed)
Arterial Line Insertion Start/End2/15/2021 6:45 AM Performed by: Candis Shine, CRNA, CRNA  Patient location: Pre-op. Preanesthetic checklist: patient identified, IV checked, site marked, risks and benefits discussed, surgical consent, monitors and equipment checked, pre-op evaluation, timeout performed and anesthesia consent Lidocaine 1% used for infiltration Left, radial was placed Catheter size: 20 G Hand hygiene performed  and maximum sterile barriers used   Attempts: 1 Procedure performed without using ultrasound guided technique. Following insertion, dressing applied and Biopatch. Post procedure assessment: normal and unchanged  Patient tolerated the procedure well with no immediate complications.

## 2019-04-13 NOTE — Transfer of Care (Signed)
Immediate Anesthesia Transfer of Care Note  Patient: Tony [name]  Procedure(s) Performed: REDO AORTIC VALVE REPLACEMENT (AVR) using INSPIRIS 21 MM RESILIA AORTIC VALVE. (N/A Chest) TRANSESOPHAGEAL ECHOCARDIOGRAM (TEE) (N/A )  Patient Location: SICU  Anesthesia Type:General  Level of Consciousness: Patient remains intubated per anesthesia plan  Airway & Oxygen Therapy: Patient remains intubated per anesthesia plan and Patient placed on Ventilator (see vital sign flow sheet for setting)  Post-op Assessment: Report given to RN and Post -op Vital signs reviewed and stable  Post vital signs: Reviewed and stable  Last Vitals:  Vitals Value Taken Time  BP 112/56   Temp    Pulse 90   Resp    SpO2 98     Last Pain:  Vitals:   04/13/19 0611  TempSrc:   PainSc: 0-No pain         Complications: No apparent anesthesia complications

## 2019-04-13 NOTE — Anesthesia Procedure Notes (Signed)
Central Venous Catheter Insertion Performed by: Oleta Mouse, MD, anesthesiologist Start/End2/15/2021 6:58 AM, 04/13/2019 7:05 AM Patient location: Pre-op. Preanesthetic checklist: patient identified, IV checked, site marked, risks and benefits discussed, surgical consent, monitors and equipment checked, pre-op evaluation, timeout performed and anesthesia consent Hand hygiene performed  and maximum sterile barriers used  PA cath was placed.Swan type:thermodilution Procedure performed without using ultrasound guided technique. Attempts: 1 Patient tolerated the procedure well with no immediate complications.

## 2019-04-13 NOTE — Anesthesia Preprocedure Evaluation (Addendum)
Anesthesia Evaluation  Patient identified by MRN, date of birth, ID band Patient awake    Reviewed: Allergy & Precautions, NPO status , Patient's Chart, lab work & pertinent test results, reviewed documented beta blocker date and time   History of Anesthesia Complications Negative for: history of anesthetic complications  Airway Mallampati: I  TM Distance: >3 FB Neck ROM: Full    Dental  (+) Dental Advisory Given   Pulmonary shortness of breath, neg recent URI, Current Smoker and Patient abstained from smoking.,    Pulmonary exam normal        Cardiovascular hypertension, Pt. on medications and Pt. on home beta blockers + Valvular Problems/Murmurs AS and AI  Rhythm:Regular + Systolic murmurs 1. Left ventricular ejection fraction, by visual estimation, is 55 to  60%. The left ventricle has normal function. There is mildly increased  left ventricular hypertrophy.  2. Global right ventricle has normal systolic function.The right  ventricular size is normal.  3. Left atrial size was moderately dilated.  4. Right atrial size was normal.  5. The mitral valve is grossly normal. Moderate mitral valve  regurgitation.  6. The tricuspid valve is normal in structure. Tricuspid valve  regurgitation is trivial.  7. Aortic valve regurgitation is moderate. Severe aortic valve stenosis.  8. The pulmonic valve was not well visualized. Pulmonic valve  regurgitation is not visualized.  9. Moderate plaque invoving the descending aorta.  10. Normal LV function; s/p AVR with severe AS (mean gradient 51 mmg) and  moderate AI; there is an echolucent space contiguous to prosthetic aortic  valve with flow; ? prior abscess cavity; moderate MR; trace TR.    Neuro/Psych PSYCHIATRIC DISORDERS Anxiety negative neurological ROS     GI/Hepatic Neg liver ROS, GERD  Medicated and Controlled,  Endo/Other  diabetes, Type 2  Renal/GU negative Renal  ROS     Musculoskeletal  (+) Arthritis ,   Abdominal   Peds  Hematology   Anesthesia Other Findings   Reproductive/Obstetrics                            Anesthesia Physical Anesthesia Plan  ASA: IV  Anesthesia Plan: General   Post-op Pain Management:    Induction: Intravenous  PONV Risk Score and Plan: 1 and Treatment may vary due to age or medical condition  Airway Management Planned: Oral ETT  Additional Equipment: Arterial line, PA Cath, TEE and Ultrasound Guidance Line Placement  Intra-op Plan:   Post-operative Plan: Post-operative intubation/ventilation  Informed Consent: I have reviewed the patients History and Physical, chart, labs and discussed the procedure including the risks, benefits and alternatives for the proposed anesthesia with the patient or authorized representative who has indicated his/her understanding and acceptance.     Dental advisory given  Plan Discussed with: CRNA and Surgeon  Anesthesia Plan Comments:        Anesthesia Quick Evaluation

## 2019-04-13 NOTE — Procedures (Signed)
Extubation Procedure Note  Patient Details:   Name: Tony [name] DOB: August 02, 1945 MRN: [id number]   Airway Documentation:    Vent end date: 04/13/19 Vent end time: 2030   Evaluation  O2 sats: stable throughout Complications: No apparent complications Patient did tolerate procedure well. Bilateral Breath Sounds: Clear   Yes  Graciella Freer 04/13/2019, 8:33 PM   Pt extubated per rapid wean protocol. Pt able to perform VC of 1.5L and NIF of -40. Pt had positive cuff leak. Pt was placed on 4L [place], vitals stable at this time.

## 2019-04-14 ENCOUNTER — Inpatient Hospital Stay (HOSPITAL_COMMUNITY): Payer: Medicare Other

## 2019-04-14 ENCOUNTER — Encounter: Payer: Self-pay | Admitting: *Deleted

## 2019-04-14 LAB — BASIC METABOLIC PANEL
Anion gap: 9 (ref 5–15)
Anion gap: 10 (ref 5–15)
BUN: 9 mg/dL (ref 8–23)
BUN: 11 mg/dL (ref 8–23)
CO2: 22 mmol/L (ref 22–32)
CO2: 20 mmol/L — ABNORMAL LOW (ref 22–32)
Calcium: 7.2 mg/dL — ABNORMAL LOW (ref 8.9–10.3)
Calcium: 7.4 mg/dL — ABNORMAL LOW (ref 8.9–10.3)
Chloride: 105 mmol/L (ref 98–111)
Chloride: 105 mmol/L (ref 98–111)
Creatinine, Ser: 0.79 mg/dL (ref 0.61–1.24)
Creatinine, Ser: 0.86 mg/dL (ref 0.61–1.24)
GFR calc Af Amer: >60 mL/min (ref >60)
GFR calc Af Amer: >60 mL/min (ref >60)
GFR calc non Af Amer: >60 mL/min (ref >60)
GFR calc non Af Amer: >60 mL/min (ref >60)
Glucose, Bld: 110 mg/dL — ABNORMAL HIGH (ref 70–99)
Glucose, Bld: 89 mg/dL (ref 70–99)
Potassium: 4.4 mmol/L (ref 3.5–5.1)
Potassium: 4.1 mmol/L (ref 3.5–5.1)
Sodium: 136 mmol/L (ref 135–145)
Sodium: 135 mmol/L (ref 135–145)

## 2019-04-14 LAB — CBC
HCT: 30.8 % — ABNORMAL LOW (ref 39.0–52.0)
HCT: 30.4 % — ABNORMAL LOW (ref 39.0–52.0)
Hemoglobin: 10 g/dL — ABNORMAL LOW (ref 13.0–17.0)
Hemoglobin: 9.7 g/dL — ABNORMAL LOW (ref 13.0–17.0)
MCH: 31.8 pg (ref 26.0–34.0)
MCH: 31.8 pg (ref 26.0–34.0)
MCHC: 32.5 g/dL (ref 30.0–36.0)
MCHC: 31.9 g/dL (ref 30.0–36.0)
MCV: 98.1 fL (ref 80.0–100.0)
MCV: 99.7 fL (ref 80.0–100.0)
Platelets: 129 10*3/uL — ABNORMAL LOW (ref 150–400)
Platelets: 95 10*3/uL — ABNORMAL LOW (ref 150–400)
RBC: 3.14 MIL/uL — ABNORMAL LOW (ref 4.22–5.81)
RBC: 3.05 MIL/uL — ABNORMAL LOW (ref 4.22–5.81)
RDW: 13.7 % (ref 11.5–15.5)
RDW: 13.8 % (ref 11.5–15.5)
WBC: 16.3 10*3/uL — ABNORMAL HIGH (ref 4.0–10.5)
WBC: 13 10*3/uL — ABNORMAL HIGH (ref 4.0–10.5)
nRBC: 0 % (ref 0.0–0.2)
nRBC: 0.2 % (ref 0.0–0.2)

## 2019-04-14 LAB — GLUCOSE, CAPILLARY
Glucose-Capillary: 107 mg/dL — ABNORMAL HIGH (ref 70–99)
Glucose-Capillary: 112 mg/dL — ABNORMAL HIGH (ref 70–99)
Glucose-Capillary: 113 mg/dL — ABNORMAL HIGH (ref 70–99)
Glucose-Capillary: 115 mg/dL — ABNORMAL HIGH (ref 70–99)
Glucose-Capillary: 116 mg/dL — ABNORMAL HIGH (ref 70–99)
Glucose-Capillary: 117 mg/dL — ABNORMAL HIGH (ref 70–99)
Glucose-Capillary: 117 mg/dL — ABNORMAL HIGH (ref 70–99)
Glucose-Capillary: 135 mg/dL — ABNORMAL HIGH (ref 70–99)
Glucose-Capillary: 139 mg/dL — ABNORMAL HIGH (ref 70–99)
Glucose-Capillary: 141 mg/dL — ABNORMAL HIGH (ref 70–99)
Glucose-Capillary: 142 mg/dL — ABNORMAL HIGH (ref 70–99)
Glucose-Capillary: 142 mg/dL — ABNORMAL HIGH (ref 70–99)
Glucose-Capillary: 158 mg/dL — ABNORMAL HIGH (ref 70–99)
Glucose-Capillary: 86 mg/dL (ref 70–99)

## 2019-04-14 LAB — MAGNESIUM
Magnesium: 2.3 mg/dL (ref 1.7–2.4)
Magnesium: 2.2 mg/dL (ref 1.7–2.4)

## 2019-04-14 LAB — PREPARE PLATELET PHERESIS: Unit division: 0

## 2019-04-14 LAB — BPAM PLATELET PHERESIS
Blood Product Expiration Date: 202102172359
ISSUE DATE / TIME: 202102151300
Unit Type and Rh: 6200

## 2019-04-14 LAB — SURGICAL PATHOLOGY

## 2019-04-14 MED ORDER — SODIUM CHLORIDE 0.9% FLUSH
10.0000 mL | Freq: Two times a day (BID) | INTRAVENOUS | Status: DC
  Administered 2019-04-14 – 2019-04-15 (×2): 10 mL

## 2019-04-14 MED ORDER — SODIUM CHLORIDE 0.9% FLUSH: 10.0000 mL | INTRAVENOUS | Status: DC | PRN

## 2019-04-14 MED ORDER — INSULIN ASPART 100 UNIT/ML ~~LOC~~ SOLN
0.0000 [IU] | SUBCUTANEOUS | Status: DC
  Administered 2019-04-14 – 2019-04-15 (×2): 2 [IU] via SUBCUTANEOUS

## 2019-04-14 MED ORDER — TRAMADOL HCL 50 MG PO TABS
50.0000 mg | ORAL_TABLET | ORAL | Status: DC | PRN
  Administered 2019-04-14 – 2019-04-15 (×3): 50 mg via ORAL
  Filled 2019-04-14 (×3): qty 1

## 2019-04-14 MED ORDER — INSULIN DETEMIR 100 UNIT/ML ~~LOC~~ SOLN
40.0000 [IU] | Freq: Every day | SUBCUTANEOUS | Status: DC
  Administered 2019-04-14: 40 [IU] via SUBCUTANEOUS
  Filled 2019-04-14 (×2): qty 0.4

## 2019-04-14 MED ORDER — ENOXAPARIN SODIUM 40 MG/0.4ML ~~LOC~~ SOLN
40.0000 mg | Freq: Every day | SUBCUTANEOUS | Status: DC
  Administered 2019-04-14 – 2019-04-17 (×4): 40 mg via SUBCUTANEOUS
  Filled 2019-04-14 (×4): qty 0.4

## 2019-04-14 MED ORDER — INSULIN DETEMIR 100 UNIT/ML ~~LOC~~ SOLN
40.0000 [IU] | Freq: Every day | SUBCUTANEOUS | Status: DC
  Filled 2019-04-14 (×2): qty 0.4

## 2019-04-14 MED ORDER — ORAL CARE MOUTH RINSE
15.0000 mL | Freq: Two times a day (BID) | OROMUCOSAL | Status: DC
  Administered 2019-04-14 – 2019-04-18 (×5): 15 mL via OROMUCOSAL

## 2019-04-14 MED FILL — Thrombin (Recombinant) For Soln 20000 Unit: CUTANEOUS | Qty: 1 | Status: AC

## 2019-04-14 NOTE — Progress Notes (Signed)
Patient ID: Tony [name], male   DOB: [birth year], 74 y.o.   MRN: [id number] EVENING ROUNDS NOTE :     [address]       [place] [address]             [phone]                 1 Day Post-Op Procedure(s) (LRB): REDO AORTIC VALVE REPLACEMENT (AVR) using INSPIRIS 21 MM RESILIA AORTIC VALVE. (N/A) TRANSESOPHAGEAL ECHOCARDIOGRAM (TEE) (N/A)  Total Length of Stay:  LOS: 1 day  BP (!) 90/45   Pulse 100   Temp 98.1 F (36.7 C)   Resp (!) 27   Ht 5\' 10"  (1.778 m)   Wt 77 kg   SpO2 97%   BMI 24.36 kg/m   .Intake/Output      02/15 0701 - 02/16 0700 02/16 0701 - 02/17 0700   P.O. 360    I.V. (mL/kg) 2683.9 (34.9) 266.5 (3.5)   Blood 774    IV Piggyback 990    Total Intake(mL/kg) 4807.9 (62.4) 266.5 (3.5)   Urine (mL/kg/hr) 3260 (1.8) 115 (0.1)   Blood 1400    Chest Tube 270 90   Total Output 4930 205   Net -122.1 +61.5          . sodium chloride    . sodium chloride    . sodium chloride 20 mL/hr at 04/13/19 1546  . cefUROXime (ZINACEF)  IV Stopped (04/14/19 0442)  . DOPamine Stopped (04/14/19 1340)  . insulin Stopped (04/14/19 1254)  . lactated ringers 20 mL/hr at 04/14/19 1700  . lactated ringers    . nitroGLYCERIN    . phenylephrine (NEO-SYNEPHRINE) Adult infusion Stopped (04/14/19 0955)     Lab Results  Component Value Date   WBC 13.0 (H) 04/14/2019   HGB 9.7 (L) 04/14/2019   HCT 30.4 (L) 04/14/2019   PLT 95 (L) 04/14/2019   GLUCOSE 89 04/14/2019   ALT 54 (H) 04/09/2019   AST 68 (H) 04/09/2019   NA 135 04/14/2019   K 4.1 04/14/2019   CL 105 04/14/2019   CREATININE 0.86 04/14/2019   BUN 11 04/14/2019   CO2 20 (L) 04/14/2019   INR 1.5 (H) 04/13/2019   HGBA1C 7.4 (H) 04/09/2019  up in chair  Stable day    Grace Isaac MD  Beeper [phone] Office [phone] 04/14/2019 6:11 PM

## 2019-04-14 NOTE — Progress Notes (Signed)
1 Day Post-Op Procedure(s) (LRB): REDO AORTIC VALVE REPLACEMENT (AVR) using INSPIRIS 21 MM RESILIA AORTIC VALVE. (N/A) TRANSESOPHAGEAL ECHOCARDIOGRAM (TEE) (N/A) Subjective: No complaints, feels well so far  Objective: Vital signs in last 24 hours: Temp:  [97.9 F (36.6 C)-99.1 F (37.3 C)] 98.1 F (36.7 C) (02/16 0945) Pulse Rate:  [88-111] 108 (02/16 0945) Cardiac Rhythm: Atrial paced (02/16 0400) Resp:  [9-26] 19 (02/16 0945) BP: (90-128)/(57-80) 113/70 (02/16 0900) SpO2:  [89 %-100 %] 96 % (02/16 0945) Arterial Line BP: (73-133)/(37-73) 95/51 (02/16 0945) FiO2 (%):  [40 %-80 %] 40 % (02/15 1949) Weight:  [77 kg] 77 kg (02/16 0500)  Hemodynamic parameters for last 24 hours: PAP: (35-50)/(15-29) 50/29 CO:  [2.7 L/min-4.4 L/min] 4.4 L/min CI:  [1.4 L/min/m2-2.3 L/min/m2] 2.3 L/min/m2  Intake/Output from previous day: 02/15 0701 - 02/16 0700 In: 4807.9 [P.O.:360; I.V.:2683.9; Blood:774; IV Piggyback:990] Out: [id number] [Urine:3260; Blood:1400; Chest Tube:270] Intake/Output this shift: Total I/O In: 89.8 [I.V.:89.8] Out: 45 [Urine:45]  General appearance: alert and cooperative Neurologic: intact Heart: regular rate and rhythm, S1, S2 normal, no murmur, click, rub or gallop Lungs: clear to auscultation bilaterally Extremities: edema mild Wound: dressing dry  Lab Results: Recent Labs    04/13/19 1812 04/13/19 1812 04/13/19 2020 04/14/19 0519  WBC 16.4*  --   --  16.3*  HGB 11.0*   < > 11.9* 10.0*  HCT 34.1*   < > 35.0* 30.8*  PLT 143*  --   --  129*   < > = values in this interval not displayed.   BMET:  Recent Labs    04/13/19 1812 04/13/19 1812 04/13/19 2020 04/14/19 0519  NA 138   < > 141 136  K 4.2   < > 4.1 4.4  CL 107  --   --  105  CO2 22  --   --  22  GLUCOSE 117*  --   --  110*  BUN 8  --   --  9  CREATININE 0.72  --   --  0.79  CALCIUM 6.9*  --   --  7.2*   < > = values in this interval not displayed.    PT/INR:  Recent Labs     04/13/19 1524  LABPROT 18.3*  INR 1.5*   ABG    Component Value Date/Time   PHART 7.339 (L) 04/13/2019 2020   HCO3 21.6 04/13/2019 2020   TCO2 23 04/13/2019 2020   ACIDBASEDEF 4.0 (H) 04/13/2019 2020   O2SAT 97.0 04/13/2019 2020   CBG (last 3)  Recent Labs    04/14/19 0513 04/14/19 0613 04/14/19 0724  GLUCAP 113* 117* 116*   CXR: clear  ECG: sinus, no acute changes  Assessment/Plan: S/P Procedure(s) (LRB): REDO AORTIC VALVE REPLACEMENT (AVR) using INSPIRIS 21 MM RESILIA AORTIC VALVE. (N/A) TRANSESOPHAGEAL ECHOCARDIOGRAM (TEE) (N/A)  POD 1 Hemodynamically stable in sinus rhythm. Wean off dopamine and neo.  DC swan.  DM: glucose under good control. Start Levemir and SSI.  Hold on diuresis until stable off neo.  DC chest tubes.  OOB, IS.   LOS: 1 day    Gaye Pollack 04/14/2019

## 2019-04-15 ENCOUNTER — Inpatient Hospital Stay (HOSPITAL_COMMUNITY): Payer: Medicare Other

## 2019-04-15 LAB — BASIC METABOLIC PANEL
Anion gap: 14 (ref 5–15)
BUN: 13 mg/dL (ref 8–23)
CO2: 23 mmol/L (ref 22–32)
Calcium: 8 mg/dL — ABNORMAL LOW (ref 8.9–10.3)
Chloride: 99 mmol/L (ref 98–111)
Creatinine, Ser: 0.97 mg/dL (ref 0.61–1.24)
GFR calc Af Amer: >60 mL/min (ref >60)
GFR calc non Af Amer: >60 mL/min (ref >60)
Glucose, Bld: 148 mg/dL — ABNORMAL HIGH (ref 70–99)
Potassium: 4.2 mmol/L (ref 3.5–5.1)
Sodium: 136 mmol/L (ref 135–145)

## 2019-04-15 LAB — CBC
HCT: 36.5 % — ABNORMAL LOW (ref 39.0–52.0)
Hemoglobin: 11.5 g/dL — ABNORMAL LOW (ref 13.0–17.0)
MCH: 31.3 pg (ref 26.0–34.0)
MCHC: 31.5 g/dL (ref 30.0–36.0)
MCV: 99.5 fL (ref 80.0–100.0)
Platelets: 133 10*3/uL — ABNORMAL LOW (ref 150–400)
RBC: 3.67 MIL/uL — ABNORMAL LOW (ref 4.22–5.81)
RDW: 13.8 % (ref 11.5–15.5)
WBC: 18.9 10*3/uL — ABNORMAL HIGH (ref 4.0–10.5)
nRBC: 0 % (ref 0.0–0.2)

## 2019-04-15 LAB — GLUCOSE, CAPILLARY
Glucose-Capillary: 83 mg/dL (ref 70–99)
Glucose-Capillary: 131 mg/dL — ABNORMAL HIGH (ref 70–99)
Glucose-Capillary: 128 mg/dL — ABNORMAL HIGH (ref 70–99)
Glucose-Capillary: 136 mg/dL — ABNORMAL HIGH (ref 70–99)
Glucose-Capillary: 131 mg/dL — ABNORMAL HIGH (ref 70–99)

## 2019-04-15 MED ORDER — FUROSEMIDE 40 MG PO TABS
40.0000 mg | ORAL_TABLET | Freq: Every day | ORAL | Status: AC
  Administered 2019-04-16 – 2019-04-18 (×3): 40 mg via ORAL
  Filled 2019-04-15 (×3): qty 1

## 2019-04-15 MED ORDER — SODIUM CHLORIDE 0.9% FLUSH
3.0000 mL | Freq: Two times a day (BID) | INTRAVENOUS | Status: DC
  Administered 2019-04-15 – 2019-04-18 (×5): 3 mL via INTRAVENOUS

## 2019-04-15 MED ORDER — POTASSIUM CHLORIDE CRYS ER 20 MEQ PO TBCR
20.0000 meq | EXTENDED_RELEASE_TABLET | Freq: Two times a day (BID) | ORAL | Status: AC
  Administered 2019-04-15 – 2019-04-17 (×5): 20 meq via ORAL
  Filled 2019-04-15 (×5): qty 1

## 2019-04-15 MED ORDER — SENNOSIDES-DOCUSATE SODIUM 8.6-50 MG PO TABS: 1.0000 | ORAL_TABLET | Freq: Two times a day (BID) | ORAL | Status: DC | PRN

## 2019-04-15 MED ORDER — CHLORHEXIDINE GLUCONATE CLOTH 2 % EX PADS: 6.0000 | MEDICATED_PAD | Freq: Every day | CUTANEOUS | Status: DC

## 2019-04-15 MED ORDER — PROMETHAZINE HCL 25 MG/ML IJ SOLN: 12.5000 mg | INTRAMUSCULAR | Status: DC | PRN

## 2019-04-15 MED ORDER — METOPROLOL TARTRATE 25 MG PO TABS
25.0000 mg | ORAL_TABLET | Freq: Two times a day (BID) | ORAL | Status: DC
  Administered 2019-04-15 (×2): 25 mg via ORAL
  Filled 2019-04-15 (×2): qty 1

## 2019-04-15 MED ORDER — INSULIN ASPART 100 UNIT/ML ~~LOC~~ SOLN
0.0000 [IU] | Freq: Three times a day (TID) | SUBCUTANEOUS | Status: DC
  Administered 2019-04-15 (×3): 2 [IU] via SUBCUTANEOUS
  Administered 2019-04-16: 13:00:00 12 [IU] via SUBCUTANEOUS
  Administered 2019-04-16 – 2019-04-17 (×5): 2 [IU] via SUBCUTANEOUS
  Administered 2019-04-17: 8 [IU] via SUBCUTANEOUS

## 2019-04-15 MED ORDER — ~~LOC~~ CARDIAC SURGERY, PATIENT & FAMILY EDUCATION: Freq: Once | Status: AC

## 2019-04-15 MED ORDER — POTASSIUM CHLORIDE CRYS ER 20 MEQ PO TBCR
40.0000 meq | EXTENDED_RELEASE_TABLET | Freq: Once | ORAL | Status: AC
  Administered 2019-04-15: 40 meq via ORAL
  Filled 2019-04-15: qty 2

## 2019-04-15 MED ORDER — ASPIRIN EC 325 MG PO TBEC
325.0000 mg | DELAYED_RELEASE_TABLET | Freq: Every day | ORAL | Status: DC
  Administered 2019-04-16 – 2019-04-18 (×3): 325 mg via ORAL
  Filled 2019-04-15 (×3): qty 1

## 2019-04-15 MED ORDER — FUROSEMIDE 10 MG/ML IJ SOLN
40.0000 mg | Freq: Once | INTRAMUSCULAR | Status: AC
  Administered 2019-04-15: 08:00:00 40 mg via INTRAVENOUS
  Filled 2019-04-15: qty 4

## 2019-04-15 MED ORDER — SODIUM CHLORIDE 0.9 % IV SOLN: 250.0000 mL | INTRAVENOUS | Status: DC | PRN

## 2019-04-15 MED ORDER — SODIUM CHLORIDE 0.9% FLUSH: 3.0000 mL | INTRAVENOUS | Status: DC | PRN

## 2019-04-15 NOTE — Progress Notes (Signed)
2 Days Post-Op Procedure(s) (LRB): REDO AORTIC VALVE REPLACEMENT (AVR) using INSPIRIS 21 MM RESILIA AORTIC VALVE. (N/A) TRANSESOPHAGEAL ECHOCARDIOGRAM (TEE) (N/A) Subjective: No complaints.  Objective: Vital signs in last 24 hours: Temp:  [97.9 F (36.6 C)-99 F (37.2 C)] 99 F (37.2 C) (02/17 0323) Pulse Rate:  [88-111] 90 (02/17 0700) Cardiac Rhythm: Normal sinus rhythm (02/17 0741) Resp:  [10-30] 10 (02/17 0700) BP: (74-154)/(45-89) 133/70 (02/17 0700) SpO2:  [95 %-100 %] 96 % (02/17 0700) Arterial Line BP: (73-125)/(42-59) 117/55 (02/16 1705) Weight:  [77.3 kg] 77.3 kg (02/17 0500)  Hemodynamic parameters for last 24 hours: PAP: (39-50)/(17-29) 46/22  Intake/Output from previous day: 02/16 0701 - 02/17 0700 In: 729.8 [P.O.:240; I.V.:289.8; IV Piggyback:200] Out: 605 [Urine:515; Chest Tube:90] Intake/Output this shift: No intake/output data recorded.  General appearance: alert and cooperative Neurologic: intact Heart: regular rate and rhythm, S1, S2 normal, no murmur, click, rub or gallop Lungs: clear to auscultation bilaterally Extremities: edema mild Wound: dressing dry  Lab Results: Recent Labs    04/14/19 0519 04/14/19 1601  WBC 16.3* 13.0*  HGB 10.0* 9.7*  HCT 30.8* 30.4*  PLT 129* 95*   BMET:  Recent Labs    04/14/19 0519 04/14/19 1601  NA 136 135  K 4.4 4.1  CL 105 105  CO2 22 20*  GLUCOSE 110* 89  BUN 9 11  CREATININE 0.79 0.86  CALCIUM 7.2* 7.4*    PT/INR:  Recent Labs    04/13/19 1524  LABPROT 18.3*  INR 1.5*   ABG    Component Value Date/Time   PHART 7.339 (L) 04/13/2019 2020   HCO3 21.6 04/13/2019 2020   TCO2 23 04/13/2019 2020   ACIDBASEDEF 4.0 (H) 04/13/2019 2020   O2SAT 97.0 04/13/2019 2020   CBG (last 3)  Recent Labs    04/14/19 1935 04/14/19 2333 04/15/19 0321  GLUCAP 158* 112* 83   CXR: ok  Assessment/Plan: S/P Procedure(s) (LRB): REDO AORTIC VALVE REPLACEMENT (AVR) using INSPIRIS 21 MM RESILIA AORTIC VALVE.  (N/A) TRANSESOPHAGEAL ECHOCARDIOGRAM (TEE) (N/A)  POD 2 Hemodynamically stable in sinus rhythm. Resume Lopressor.  DM: glucose under good control. Continue Levemir and SSI.  Expected postop volume excess: start diuresis.  Continue IS, ambulation   LOS: 2 days    Tony [name] 04/15/2019

## 2019-04-15 NOTE — Plan of Care (Signed)
  Problem: Clinical Measurements: Goal: Diagnostic test results will improve Outcome: Progressing Goal: Respiratory complications will improve Outcome: Progressing Goal: Cardiovascular complication will be avoided Outcome: Progressing   

## 2019-04-15 NOTE — Progress Notes (Signed)
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 No complications. VSS. Patient in supine position with HOB flat per policy. Will continue to monitor.

## 2019-04-15 NOTE — Plan of Care (Signed)

## 2019-04-15 NOTE — Progress Notes (Signed)
CARDIAC REHAB PHASE I   PRE:  Rate/Rhythm: 97 SR    BP: sitting 139/68    SaO2: 100 RA  MODE:  Ambulation: 410 ft   POST:  Rate/Rhythm: 108 ST    BP: sitting 133/71     SaO2: 100 RA  Pt able to stand fairly independently and use EVA to ambulate. Slightly unsteady, weak. He likes to push himself. More fatigued and SOB toward end of walk and rapidly sat on EOB. Pushed with arms to reposition. Discussed sternal precautions. To bed for nap. Encouraged another walk later today. [place], ACSM 04/15/2019 2:43 PM

## 2019-04-16 LAB — URINALYSIS, ROUTINE W REFLEX MICROSCOPIC
Bilirubin Urine: NEGATIVE
Glucose, UA: >=500 mg/dL — AB
Ketones, ur: NEGATIVE mg/dL
Leukocytes,Ua: NEGATIVE
Nitrite: NEGATIVE
Protein, ur: NEGATIVE mg/dL
RBC / HPF: >50 RBC/hpf — ABNORMAL HIGH (ref 0–5)
Specific Gravity, Urine: 1.003 — ABNORMAL LOW (ref 1.005–1.030)
pH: 6 (ref 5.0–8.0)

## 2019-04-16 LAB — GLUCOSE, CAPILLARY
Glucose-Capillary: 103 mg/dL — ABNORMAL HIGH (ref 70–99)
Glucose-Capillary: 279 mg/dL — ABNORMAL HIGH (ref 70–99)
Glucose-Capillary: 140 mg/dL — ABNORMAL HIGH (ref 70–99)
Glucose-Capillary: 160 mg/dL — ABNORMAL HIGH (ref 70–99)

## 2019-04-16 LAB — BASIC METABOLIC PANEL
Anion gap: 11 (ref 5–15)
BUN: 14 mg/dL (ref 8–23)
CO2: 22 mmol/L (ref 22–32)
Calcium: 7.8 mg/dL — ABNORMAL LOW (ref 8.9–10.3)
Chloride: 103 mmol/L (ref 98–111)
Creatinine, Ser: 0.92 mg/dL (ref 0.61–1.24)
GFR calc Af Amer: >60 mL/min (ref >60)
GFR calc non Af Amer: >60 mL/min (ref >60)
Glucose, Bld: 108 mg/dL — ABNORMAL HIGH (ref 70–99)
Potassium: 4.5 mmol/L (ref 3.5–5.1)
Sodium: 136 mmol/L (ref 135–145)

## 2019-04-16 MED ORDER — HYDROCHLOROTHIAZIDE 25 MG PO TABS
12.5000 mg | ORAL_TABLET | Freq: Every day | ORAL | Status: DC
  Administered 2019-04-16 – 2019-04-18 (×3): 12.5 mg via ORAL
  Filled 2019-04-16 (×3): qty 1

## 2019-04-16 MED ORDER — METOPROLOL TARTRATE 50 MG PO TABS
50.0000 mg | ORAL_TABLET | Freq: Two times a day (BID) | ORAL | Status: DC
  Administered 2019-04-16 – 2019-04-18 (×5): 50 mg via ORAL
  Filled 2019-04-16 (×5): qty 1

## 2019-04-16 MED ORDER — PHENAZOPYRIDINE HCL 100 MG PO TABS
100.0000 mg | ORAL_TABLET | Freq: Three times a day (TID) | ORAL | Status: DC
  Administered 2019-04-16 – 2019-04-18 (×4): 100 mg via ORAL
  Filled 2019-04-16 (×6): qty 1

## 2019-04-16 MED ORDER — INSULIN DETEMIR 100 UNIT/ML ~~LOC~~ SOLN
40.0000 [IU] | Freq: Every day | SUBCUTANEOUS | Status: DC
  Administered 2019-04-16: 13:00:00 40 [IU] via SUBCUTANEOUS
  Filled 2019-04-16 (×2): qty 0.4

## 2019-04-16 MED ORDER — INSULIN DETEMIR 100 UNIT/ML ~~LOC~~ SOLN: 20.0000 [IU] | Freq: Every day | SUBCUTANEOUS | Status: DC

## 2019-04-16 MED ORDER — TAMSULOSIN HCL 0.4 MG PO CAPS
0.4000 mg | ORAL_CAPSULE | Freq: Every day | ORAL | Status: DC
  Administered 2019-04-16 – 2019-04-18 (×3): 0.4 mg via ORAL
  Filled 2019-04-16 (×3): qty 1

## 2019-04-16 MED ORDER — METFORMIN HCL 500 MG PO TABS
1000.0000 mg | ORAL_TABLET | Freq: Two times a day (BID) | ORAL | Status: DC
  Administered 2019-04-16 – 2019-04-18 (×4): 1000 mg via ORAL
  Filled 2019-04-16 (×4): qty 2

## 2019-04-16 NOTE — Progress Notes (Addendum)
    [address]       ,[place] [address]             [phone]      3 Days Post-Op Procedure(s) (LRB): REDO AORTIC VALVE REPLACEMENT (AVR) using INSPIRIS 21 MM RESILIA AORTIC VALVE. (N/A) TRANSESOPHAGEAL ECHOCARDIOGRAM (TEE) (N/A) Subjective: No significant c/o except he thinks he may be having UTI sx, which he has had previously. It may be related to having had a foley  Objective: Vital signs in last 24 hours: Temp:  [98 F (36.7 C)-99.4 F (37.4 C)] 98 F (36.7 C) (02/18 0817) Pulse Rate:  [91-112] 112 (02/18 0817) Cardiac Rhythm: Sinus tachycardia (02/18 0820) Resp:  [17-22] 22 (02/18 0817) BP: (119-168)/(80-117) 168/80 (02/18 0817) SpO2:  [98 %-100 %] 100 % (02/18 0817) Weight:  [79.6 kg] 79.6 kg (02/18 0435)  Hemodynamic parameters for last 24 hours:    Intake/Output from previous day: 02/17 0701 - 02/18 0700 In: 634.5 [P.O.:480; I.V.:4.5; IV Piggyback:100] Out: 1325 [Urine:1325] Intake/Output this shift: No intake/output data recorded.  General appearance: alert, cooperative and no distress Heart: regular rate and rhythm and no murmur Lungs: clear to auscultation bilaterally Abdomen: benign Extremities: no edema Wound: dressings CDI  Lab Results: Recent Labs    04/14/19 1601 04/15/19 1001  WBC 13.0* 18.9*  HGB 9.7* 11.5*  HCT 30.4* 36.5*  PLT 95* 133*   BMET:  Recent Labs    04/15/19 1001 04/16/19 0234  NA 136 136  K 4.2 4.5  CL 99 103  CO2 23 22  GLUCOSE 148* 108*  BUN 13 14  CREATININE 0.97 0.92  CALCIUM 8.0* 7.8*    PT/INR:  Recent Labs    04/13/19 1524  LABPROT 18.3*  INR 1.5*   ABG    Component Value Date/Time   PHART 7.339 (L) 04/13/2019 2020   HCO3 21.6 04/13/2019 2020   TCO2 23 04/13/2019 2020   ACIDBASEDEF 4.0 (H) 04/13/2019 2020   O2SAT 97.0 04/13/2019 2020   CBG (last 3)  Recent Labs    04/15/19 1517 04/15/19 2106 04/16/19 0649  GLUCAP 136* 131* 103*    Meds Scheduled Meds: . aspirin  EC  325 mg Oral Daily  . chlorhexidine gluconate (MEDLINE KIT)  15 mL Mouth Rinse BID  . Chlorhexidine Gluconate Cloth  6 each Topical Daily  . enoxaparin (LOVENOX) injection  40 mg Subcutaneous QHS  . escitalopram  20 mg Oral Daily  . furosemide  40 mg Oral Daily  . insulin aspart  0-24 Units Subcutaneous TID AC & HS  . insulin detemir  40 Units Subcutaneous Daily  . mouth rinse  15 mL Mouth Rinse BID  . metoprolol tartrate  25 mg Oral BID  . pantoprazole  40 mg Oral Daily  . potassium chloride  20 mEq Oral BID  . sodium chloride flush  3 mL Intravenous Q12H   Continuous Infusions: . sodium chloride     PRN Meds:.sodium chloride, [name], oxyCODONE, senna-docusate, sodium chloride flush, traMADol  Xrays DG Chest Port 1 View  Result Date: 04/15/2019 CLINICAL DATA:  Status post aortic valve replacement EXAM: PORTABLE CHEST 1 VIEW COMPARISON:  Chest radiograph from the prior day. FINDINGS: The heart remains enlarged. Vascular calcifications are seen in the aortic arch. Median sternotomy wires and an aortic valve replacement are redemonstrated. A right internal jugular vascular sheath is unchanged. The lungs are clear. IMPRESSION: Stable cardiomegaly.  Clear lungs. Electronically Signed   By: Tyler  Litton   M.D.   On: 04/15/2019 08:42    Assessment/Plan: S/P Procedure(s) (LRB): REDO AORTIC VALVE REPLACEMENT (AVR) using INSPIRIS 21 MM RESILIA AORTIC VALVE. (N/A) TRANSESOPHAGEAL ECHOCARDIOGRAM (TEE) (N/A)  POD#3  1 conts to do well 2 some HTN/tachy- will increase betablocker and resume home hydrodiuril. 3 check UA 4 sats excellent on RA 5 resume metformin and titrate insulin - good control currently 6 normal renal fxn 7 routine pulm toilet and rehab 8 poss home 24-48 hours dep on overall progress    LOS: 3 days    Tony [name] [facility] 04/16/2019 Pager [phone]  Chart reviewed, patient examined, agree with above. He has dysuria and hesitancy. Hx of enlarged  prostate and has taken Flomax before but not now. Also hx of bladder cancer treated. Symptoms likely due to foley. Will start Flomax and add pyridium for two days. Otherwise he feels great.

## 2019-04-16 NOTE — Plan of Care (Signed)
  Problem: Clinical Measurements: Goal: Respiratory complications will improve Outcome: Progressing   

## 2019-04-16 NOTE — Discharge Summary (Signed)
Physician Discharge Summary  Patient ID: Tony [name] MRN: [id number] DOB/AGE: [birth year] 74 y.o.  Admit date: 04/13/2019 Discharge date: 04/18/2019  Admission Diagnoses: Recurrent aortic valve disease with aortic insufficiency and aortic stenosis.  Discharge Diagnoses:  Active Problems:   S/P AVR  Patient Active Problem List   Diagnosis Date Noted  . S/P AVR 04/13/2019  . H/O aortic valve replacement 01/19/2019       HPI: at time of evaluation: The patient is a 74 year old gentleman with a history of bicuspid aortic valve stenosis who underwent surgical aortic valve replacement using a 23 mm St Jude Trifecta bovine pericardial valve in 2015 at [facility] in [place], [place]. He reports an uneventful postoperative course although he felt like he never completely recovered. He said that a repeat echocardiogram within 30 days of surgery showed that the valve developed a leak. The report from that echo showed mild to moderate aortic insufficiency with a pressure half-time of 302 ms. They did not comment on whether the regurgitation was within the valve or perivalvular. Patient had normal left ventricular systolic function with evidence of diastolic dysfunction. There was mild to moderate mitral regurgitation. Then in 2019 he was found to have Enterococcus faecalis bacteremia which she developed after repeated cystoscopy for surveillance and treatment of a bladder cancer. He reportedly had an TEE at that time that was negative for any findings consistent with endocarditis. He was treated with 6 weeks of ampicillin and ceftriaxone and recovered. He said that he did not have any specific symptoms until the past 1 to 2 months when he developed exertional shortness of breath and fatigue, dizziness, and some chest discomfort with activity. He said that he is getting short of breath walking from room to room in his house. He denies orthopnea. He has had some issues with balance and uses a cane.   The  patient was admitted electively for a redo aortic valve replacement.  Discharged Condition: good  Hospital Course: Patient was admitted electively on 04/13/2019 was taken to the operating room where he underwent the below described procedure.  He tolerated it well was taken to the surgical ICU in stable condition.   Postoperative hospital course:  Patient is done quite well.  He has maintained stable hemodynamics, initially on low-dose dopamine and Neo-Synephrine.  He was weaned from the ventilator the evening of surgery without difficulty.  The Luiz Blare was discontinued on postop day #1.  Chest tubes were removed on postoperative day #1.  Blood sugars have been under good control using standard measures with gradually increasing insulin dosing.  At discharge he will be resumed on his home dosing schedule.  He has some postoperative volume overload but is responding well to diuretics.  He has maintained normal sinus rhythm and Lopressor has been resumed.  He has been uptitrated some for mild tachycardia.  He has had some postoperative hypertension and has been restarted on his HydroDIURIL.  He has had some urinary symptoms of dysuria and hesitancy and you a shows some microscopic hematuria but no evidence of infection.  He was started for a short time on Pyridium and has also been started on Flomax which will be continued as an outpatient.  Renal function is normal.  He does have a mild expected acute blood loss anemia and values are stabilized.  His most recent hemoglobin hematocrit are 11.5 and 36.5 respectively.  Incisions are noted to be healing well without evidence of infection.  He is tolerating diet and increasing activities using standard  cardiac rehab modalities.  Oxygen has been weaned and he maintains good saturations on room air.  At the time of discharge the patient is felt to be quite stable.  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   Postoperative Diagnosis:  Same   Procedure:  1. Redo Median Sternotomy 2. Extracorporeal circulation 3.   Redo Aortic valve replacement using a 21 mm Edwards INSPIRIS RESILIA pericardial valve.  Anesthesia:  General Endotracheal  Discharge Exam: Blood pressure 131/67, pulse 91, temperature 98.4 F (36.9 C), temperature source Oral, resp. rate 18, height 5\' 10"  (1.778 m), weight 74.4 kg, SpO2 97 %.  General appearance: alert, cooperative and no distress Heart: regular rate and rhythm, S1, S2 normal, no murmur, click, rub or gallop Lungs: clear to auscultation bilaterally Abdomen: soft, non-tender; bowel sounds normal; no masses,  no organomegaly Extremities: extremities normal, atraumatic, no cyanosis or edema Wound: clean and dry  Disposition: Discharge disposition: 01-Home or Self Care       Discharge Instructions    Amb Referral to Cardiac Rehabilitation   Complete by: As directed    Diagnosis: Valve Replacement   Valve: Aortic   After initial evaluation and assessments completed: Virtual Based Care may be provided alone or in conjunction with Phase 2 Cardiac Rehab based on patient barriers.: Yes     Allergies as of 04/18/2019      Reactions   Codeine Other (See Comments), Rash   Hallucinations      Medication List    STOP taking these medications   Coenzyme Q10 100 MG capsule   Fish Oil 1000 MG Caps   naproxen sodium 220 MG tablet Commonly known as: ALEVE   testosterone cypionate 200 MG/ML injection Commonly known as: DEPOTESTOSTERONE CYPIONATE   Toprol XL 50 MG 24 hr tablet Generic drug: metoprolol succinate   zolpidem 10 MG tablet Commonly known as: Ambien     TAKE these  medications   aspirin 325 MG EC tablet Take 1 tablet (325 mg total) by mouth daily. What changed:   medication strength  how much to take   B-12 2500 MCG Tabs Take 2,500 mcg by mouth 3 (three) times a week.   escitalopram 20 MG tablet Commonly known as: LEXAPRO Take 20 mg by mouth daily.   fluticasone 50 MCG/ACT nasal spray Commonly known as: FLONASE Place 1 spray into both nostrils daily as needed for allergies or rhinitis.   hydrochlorothiazide 12.5 MG tablet Commonly known as: HYDRODIURIL Take 12.5 mg by mouth daily.   Imodium A-D 2 MG tablet Generic drug: loperamide Take 2 mg by mouth as needed for diarrhea or loose stools.   insulin glargine 100 UNIT/ML injection Commonly known as: LANTUS Inject 0.5 mLs (50 Units total) into the skin daily. What changed: how much to take   lisinopril 20 MG tablet Commonly known as: ZESTRIL Take 1 tablet (20 mg total) by mouth daily.   metFORMIN 1000 MG tablet Commonly known as: GLUCOPHAGE Take 1,000 mg by mouth 2 (two) times daily with a meal.   metoprolol tartrate 50 MG tablet Commonly known as: LOPRESSOR Take 1 tablet (50 mg total) by mouth 2 (two) times daily.   omeprazole 20 MG capsule Commonly known as:  PRILOSEC Take 20 mg by mouth daily.   phenazopyridine 100 MG tablet Commonly known as: PYRIDIUM Take 1 tablet (100 mg total) by mouth 3 (three) times daily with meals.   primidone 50 MG tablet Commonly known as: MYSOLINE Take 25 mg by mouth 2 (two) times daily.   Probiotic Caps Take 1 capsule by mouth 3 (three) times a week.   Repatha SureClick [id number] MG/ML Soaj Generic drug: Evolocumab Inject 140 mg into the skin every 14 (fourteen) days.   tamsulosin 0.4 MG Caps capsule Commonly known as: FLOMAX Take 1 capsule (0.4 mg total) by mouth daily.   traMADol 50 MG tablet Commonly known as: ULTRAM Take 1 tablet (50 mg total) by mouth every 6 (six) hours as needed for moderate pain.      Follow-up Information     Gaye Pollack, MD Follow up.   Specialty: Cardiothoracic Surgery Why: See discharge paperwork for follow-up appointment with Dr. Cyndia Bent.  Also obtain a chest x-ray at [facility] 1/2-hour prior to this appointment.  It is located in the same office complex. Contact information: [address] [place] [address] [phone]        Lorretta Harp, MD Follow up on 05/06/2019.   Specialties: Cardiology, Radiology Why: Please arrive 15 minutes early for your 2:00pm post-hospital cardiology follow-up appointment. Contact information: [address] [place] [address] [phone]        nursing Follow up.   Why: Please arrive for a suture removal appointment on 2/26 at 10:00am.  Contact information: Dr. Vivi Martens office         The patient has been discharged on:   1.Beta Blocker:  Yes [ yes  ]                              No   [   ]                              If No, reason:  2.Ace Inhibitor/ARB: Yes [place]   ]                                     No  [    ]                                     If No, reason:  3.Statin:   Yes [   ]                  No  [ no  ]                  If No, reason:taking Repatha  4.Shela CommonsVelta Addison  [ yes ]                  No   [   ]                  If No, reason:  Signed: Elgie Collard PA-C 04/18/2019, 8:19 AM

## 2019-04-16 NOTE — Progress Notes (Signed)
Attempted to walk pt and he stated that he "was not feeling". Will attempt again later.  Jerald Kief, RN

## 2019-04-17 LAB — BPAM RBC
Blood Product Expiration Date: 202103012359
Blood Product Expiration Date: 202103012359
ISSUE DATE / TIME: 202102150742
ISSUE DATE / TIME: 202102150742
Unit Type and Rh: 6200
Unit Type and Rh: 6200

## 2019-04-17 LAB — TYPE AND SCREEN
ABO/RH(D): A POS
Antibody Screen: NEGATIVE
Unit division: 0
Unit division: 0

## 2019-04-17 LAB — GLUCOSE, CAPILLARY
Glucose-Capillary: 147 mg/dL — ABNORMAL HIGH (ref 70–99)
Glucose-Capillary: 218 mg/dL — ABNORMAL HIGH (ref 70–99)
Glucose-Capillary: 160 mg/dL — ABNORMAL HIGH (ref 70–99)
Glucose-Capillary: 131 mg/dL — ABNORMAL HIGH (ref 70–99)

## 2019-04-17 MED ORDER — LISINOPRIL 10 MG PO TABS
10.0000 mg | ORAL_TABLET | Freq: Every day | ORAL | Status: DC
  Administered 2019-04-17: 11:00:00 10 mg via ORAL
  Filled 2019-04-17: qty 1

## 2019-04-17 MED ORDER — INSULIN DETEMIR 100 UNIT/ML ~~LOC~~ SOLN
50.0000 [IU] | Freq: Every day | SUBCUTANEOUS | Status: DC
  Administered 2019-04-17 – 2019-04-18 (×2): 50 [IU] via SUBCUTANEOUS
  Filled 2019-04-17 (×2): qty 0.5

## 2019-04-17 MED FILL — Electrolyte-R (PH 7.4) Solution: INTRAVENOUS | Qty: 6000 | Status: AC

## 2019-04-17 MED FILL — Sodium Chloride IV Soln 0.9%: INTRAVENOUS | Qty: 3000 | Status: AC

## 2019-04-17 MED FILL — Heparin Sodium (Porcine) Inj 1000 Unit/ML: INTRAMUSCULAR | Qty: 30 | Status: AC

## 2019-04-17 MED FILL — Mannitol IV Soln 20%: INTRAVENOUS | Qty: 500 | Status: AC

## 2019-04-17 MED FILL — Lidocaine HCl Local Soln Prefilled Syringe 100 MG/5ML (2%): INTRAMUSCULAR | Qty: 10 | Status: AC

## 2019-04-17 MED FILL — Sodium Bicarbonate IV Soln 8.4%: INTRAVENOUS | Qty: 50 | Status: AC

## 2019-04-17 NOTE — Progress Notes (Signed)
Patient walked approximately 400 ft. Minimal assist. Slightly winded but other wise done well.

## 2019-04-17 NOTE — Plan of Care (Signed)
  Problem: Education: Goal: Knowledge of General Education information will improve Description Including pain rating scale, medication(s)/side effects and non-pharmacologic comfort measures Outcome: Progressing   Problem: Health Behavior/Discharge Planning: Goal: Ability to manage health-related needs will improve Outcome: Progressing   

## 2019-04-17 NOTE — Progress Notes (Signed)
CARDIAC REHAB PHASE I   PRE:  Rate/Rhythm: 88 SR    BP: sitting 145/86    SaO2: 98 RA  MODE:  Ambulation: 320 ft   POST:  Rate/Rhythm: 106 ST    BP: sitting 161/75     SaO2: 100 RA  Pt needed assist to roll on bed to sit up following sternal precautions. Stood independently and walked with just standby assist. Pt gets DOE. Rest x1. Likes to push himself. To recliner but then to have wires pulled so back to bed. Discussed sternal precautions, IS, exercise guidelines, DM, and CRPII. Will refer to [facility].  [place], ACSM 04/17/2019 12:08 PM

## 2019-04-17 NOTE — Progress Notes (Signed)
EPWs removed per protocol.  Pt tolerated well.  Instructed on need for 1 hr of bedrest with monitoring of BP and HR.  Will continue to monitor.

## 2019-04-17 NOTE — Progress Notes (Addendum)
[name], [name], [name], cooperative and no distress Heart: regular rate and rhythm Lungs: clear to auscultation bilaterally Abdomen: benign Extremities: no edema Wound: incis healing well  Lab Results: Recent Labs    04/14/19 1601 04/15/19 1001  WBC 13.0* 18.9*  HGB 9.7* 11.5*  HCT 30.4* 36.5*  PLT 95* 133*   BMET:  Recent Labs    04/15/19 1001 04/16/19 0234  NA 136 136  K 4.2 4.5  CL 99 103  CO2 23 22  GLUCOSE 148* 108*  BUN 13 14  CREATININE 0.97 0.92  CALCIUM 8.0* 7.8*    PT/INR: No results for input(s): LABPROT, INR in the last 72 hours. ABG    Component Value Date/Time   PHART 7.339 (L) 04/13/2019 2020   HCO3 21.6 04/13/2019 2020   TCO2 23 04/13/2019 2020   ACIDBASEDEF 4.0 (H) 04/13/2019 2020   O2SAT 97.0 04/13/2019 2020   CBG (last 3)  Recent Labs    04/16/19 1544 04/16/19 2109 04/17/19 0615  GLUCAP 140* 160* 147*    Meds Scheduled Meds: . aspirin EC  325 mg Oral Daily  . chlorhexidine gluconate (MEDLINE KIT)  15 mL Mouth Rinse BID  .  Chlorhexidine Gluconate Cloth  6 each Topical Daily  . enoxaparin (LOVENOX) injection  40 mg Subcutaneous QHS  . escitalopram  20 mg Oral Daily  . furosemide  40 mg Oral Daily  . hydrochlorothiazide  12.5 mg Oral Daily  . insulin aspart  0-24 Units Subcutaneous TID AC & HS  . insulin detemir  40 Units Subcutaneous Daily  . mouth rinse  15 mL Mouth Rinse BID  . metFORMIN  1,[name], [name], oxyCODONE, senna-docusate, sodium chloride flush, traMADol  Xrays No results found.  Assessment/Plan: S/P Procedure(s) (LRB): REDO AORTIC VALVE REPLACEMENT (AVR) using INSPIRIS 21 MM RESILIA AORTIC VALVE. (N/A) TRANSESOPHAGEAL ECHOCARDIOGRAM (TEE) (N/A) POD#4 1 conts to do very well 2 Hemodyn stable with some HTN- will add low dose ACE-I 3 micro hematuria- foley related with HX of bladder cancer/prostate issues- cont flomax and pyridium for now 4 sats good on RA 5 SR/ST- cont beta  blocker- d/c epw's today 6 increase insulin a little- resume home DM meds at D/C 7 prob home in am if no new issues  LOS: 4 days    Tony [name] [facility] 04/17/2019 Pager [phone]   Chart reviewed, patient examined, [name]

## 2019-04-18 LAB — GLUCOSE, CAPILLARY
Glucose-Capillary: 73 mg/dL (ref 70–99)
Glucose-Capillary: 186 mg/dL — ABNORMAL HIGH (ref 70–99)

## 2019-04-18 MED ORDER — ASPIRIN 325 MG PO TBEC: 325.0000 mg | DELAYED_RELEASE_TABLET | Freq: Every day | ORAL | 0 refills | Status: AC

## 2019-04-18 MED ORDER — INSULIN GLARGINE 100 UNIT/ML ~~LOC~~ SOLN: 50.0000 [IU] | Freq: Every day | SUBCUTANEOUS | 11 refills | Status: AC

## 2019-04-18 MED ORDER — TRAMADOL HCL 50 MG PO TABS: 50.0000 mg | ORAL_TABLET | Freq: Four times a day (QID) | ORAL | 0 refills | Status: AC | PRN

## 2019-04-18 MED ORDER — PHENAZOPYRIDINE HCL 100 MG PO TABS: 100.0000 mg | ORAL_TABLET | Freq: Three times a day (TID) | ORAL | 0 refills | Status: AC

## 2019-04-18 MED ORDER — TAMSULOSIN HCL 0.4 MG PO CAPS: 0.4000 mg | ORAL_CAPSULE | Freq: Every day | ORAL | 1 refills | Status: AC

## 2019-04-18 MED ORDER — LISINOPRIL 10 MG PO TABS
20.0000 mg | ORAL_TABLET | Freq: Every day | ORAL | Status: DC
  Administered 2019-04-18: 20 mg via ORAL
  Filled 2019-04-18: qty 2

## 2019-04-18 MED ORDER — METOPROLOL TARTRATE 50 MG PO TABS: 50.0000 mg | ORAL_TABLET | Freq: Two times a day (BID) | ORAL | 1 refills | Status: AC

## 2019-04-18 MED ORDER — LISINOPRIL 20 MG PO TABS: 20.0000 mg | ORAL_TABLET | Freq: Every day | ORAL | 1 refills | Status: AC

## 2019-04-18 NOTE — Progress Notes (Signed)
CARDIAC REHAB PHASE I   PRE:  Rate/Rhythm: 91 SR  BP:  Supine: 152/69   Sitting:   Standing:    SaO2: 97 RA  MODE:  Ambulation: 470 ft   POST:  Rate/Rhythm: 106 ST  BP:  Supine:   Sitting: 167/63  Standing:    SaO2: 98 RA 0930-1010  Assisted X 1 to ambulate. Gait steady. VS stable. Pt denies any co,plaitns with walking. Pt back to bed after walk. He denies any questions related to discharge education provided yesterday. I encouraged walking at home. He states that he wants to get in the Outpt. CRP in [place].  Rodney Langton RN 04/18/2019 10:07 AM

## 2019-04-18 NOTE — Progress Notes (Signed)
Discharge instructions given to Tony [name].  Discussed signs and symptoms to watch for and when to contact the physician.  Discussed activities and lifting restrictions.  Discussed new medications, medication changes and side effects.. Discussed follow up appointments.  Verbalized understanding.

## 2019-04-18 NOTE — Progress Notes (Addendum)
      [address]       [place] [address]             [phone]      5 Days Post-Op Procedure(s) (LRB): REDO AORTIC VALVE REPLACEMENT (AVR) using INSPIRIS 21 MM RESILIA AORTIC VALVE. (N/A) TRANSESOPHAGEAL ECHOCARDIOGRAM (TEE) (N/A) Subjective: No issues overnight. He feels good this morning.   Objective: Vital signs in last 24 hours: Temp:  [98.1 F (36.7 C)-98.9 F (37.2 C)] 98.1 F (36.7 C) (02/20 0504) Pulse Rate:  [70-92] 70 (02/20 0504) Cardiac Rhythm: Normal sinus rhythm;Supraventricular tachycardia (02/20 0535) Resp:  [13-31] 13 (02/20 0504) BP: (132-162)/(62-86) 150/74 (02/20 0504) SpO2:  [97 %-100 %] 97 % (02/20 0504) Weight:  [74.4 kg] 74.4 kg (02/20 0535)     Intake/Output from previous day: 02/19 0701 - 02/20 0700 In: 240 [P.O.:240] Out: -  Intake/Output this shift: No intake/output data recorded.  General appearance: alert, cooperative and no distress Heart: regular rate and rhythm, S1, S2 normal, no murmur, click, rub or gallop Lungs: clear to auscultation bilaterally Abdomen: soft, non-tender; bowel sounds normal; no masses,  no organomegaly Extremities: extremities normal, atraumatic, no cyanosis or edema Wound: clean and dry  Lab Results: Recent Labs    04/15/19 1001  WBC 18.9*  HGB 11.5*  HCT 36.5*  PLT 133*   BMET:  Recent Labs    04/15/19 1001 04/16/19 0234  NA 136 136  K 4.2 4.5  CL 99 103  CO2 23 22  GLUCOSE 148* 108*  BUN 13 14  CREATININE 0.97 0.92  CALCIUM 8.0* 7.8*    PT/INR: No results for input(s): LABPROT, INR in the last 72 hours. ABG    Component Value Date/Time   PHART 7.339 (L) 04/13/2019 2020   HCO3 21.6 04/13/2019 2020   TCO2 23 04/13/2019 2020   ACIDBASEDEF 4.0 (H) 04/13/2019 2020   O2SAT 97.0 04/13/2019 2020   CBG (last 3)  Recent Labs    04/17/19 1725 04/17/19 2142 04/18/19 0626  GLUCAP 160* 131* 73    Assessment/Plan: S/P Procedure(s) (LRB): REDO AORTIC VALVE REPLACEMENT  (AVR) using INSPIRIS 21 MM RESILIA AORTIC VALVE. (N/A) TRANSESOPHAGEAL ECHOCARDIOGRAM (TEE) (N/A)  1. CV-BP has been elevated. Will increase the lisinopril. NSR in the 90s.  2. micro hematuria- foley related with HX of bladder cancer/prostate issues- cont flomax and pyridium for now 3. Excellent oxygen saturation on room air.  4. Renal- creatinine 0.92, electrolytes okay. Remains 2kg up from his baseline weight.  5. H and H stable, expected acute blood loss anemia 6. Will resume home DM medication on discharge.   Plan: Will discharge today. Suture removal and follow-up appointment is in the chart. All questions answered and discharge instructions reviewed. Is on primidone for a benign tremor-will resume at d/c    LOS: 5 days    Tony [name] 04/18/2019

## 2019-04-18 NOTE — Discharge Instructions (Signed)
Surgical Aortic Valve Replacement, Care After This sheet gives you information about how to care for yourself after your procedure. Your health care provider may also give you more specific instructions. If you have problems or questions, contact your health care provider. What can I expect after the procedure? After the procedure, it is common to have pain around your incision area. Follow these instructions at home: Medicines  Take over-the-counter and prescription medicines only as told by your health care provider.  If you were prescribed an antibiotic medicine, take it as told by your health care provider. Do not stop taking the antibiotic even if you start to feel better.  If you have a mechanical prosthesis, you may be given a blood thinner called warfarin. Follow instructions carefully on how to take this medicine.  Ask your health care provider if the medicine prescribed to you: ? Requires you to avoid driving or using heavy machinery. ? Can cause constipation. You may need to take actions to prevent or treat constipation, such as:  Take over-the-counter or prescription medicines.  Eat foods that are high in fiber, such as beans, whole grains, and fresh fruits and vegetables.  Limit foods that are high in fat and processed sugars, such as fried or sweet foods. Eating and drinking      Limit how much caffeine you drink. Caffeine can affect your heart's rate and rhythm.  Do not drink alcohol if: ? Your health care provider tells you not to drink. ? You are pregnant, may be pregnant, or are planning to become pregnant.  Drink enough fluid to keep your urine pale yellow.  Eat a heart-healthy diet that includes fruits, vegetables, whole grains, low-fat dairy products, and lean proteins like poultry and eggs. Incision care   Follow instructions from your health care provider about how to take care of your incision. Make sure you: ? Wash your hands with soap and water before  and after you change your bandage (dressing). If soap and water are not available, use hand sanitizer. ? Change your dressing as told by your health care provider. ? Leave stitches (sutures), skin glue, or adhesive strips in place. These skin closures may need to stay in place for 2 weeks or longer. If adhesive strip edges start to loosen and curl up, you may trim the loose edges. Do not remove adhesive strips completely unless your health care provider tells you to do that.  Check your incision area every day for signs of infection. Check for: ? Redness, swelling, or increasing pain. ? Fluid or blood. ? Warmth. ? Pus or a bad smell. Activity  Return to your normal activities as told by your health care provider. Most patients will need to limit any lifting or strenuous activity for 4-6 weeks.  Avoid sitting for a long time without moving. Get up to take short walks every 1-2 hours.  Do exercises as told by your health care provider.  Do not lift anything that is heavier than 10 lb (4.5 kg), or the limit that you are told, until your health care provider says that it is safe.  Avoid pushing or pulling things with your arms until your health care provider approves. This includes pulling on handrails to help you climb stairs. Lifestyle  Do not use any products that contain nicotine or tobacco, such as cigarettes, e-cigarettes, and chewing tobacco. These can delay incision healing after surgery. If you need help quitting, ask your health care provider.  Resume sexual activity as told   by your health care provider. If you have erectile dysfunction, do not use medicines to treat this condition unless your health care provider approves.  Work with your health care provider to: ? Keep your blood pressure and cholesterol under control. ? Manage any other heart conditions that you have. ? Maintain a healthy weight. Driving and travel  Do not drive until your health care provider approves. Ask  your health care provider when it is safe for you to drive.  Avoid airplane travel for as long as told by your health care provider.  When you travel, bring a list of your medicines and a record of your medical history with you. Carry your medicines with you. General instructions  Do not take baths, swim, or use a hot tub until your health care provider approves. You may shower  Do not strain to have a bowel movement.  Avoid crossing your legs while sitting down.  Check your temperature every day for a fever. A fever may be a sign of infection.  If you are a woman and you plan to become pregnant, talk with your health care provider before you become pregnant.  Wear compression stockings as told by your health care provider. These stockings help to prevent blood clots and reduce swelling in your legs.  Tell all health care providers who care for you that you have an artificial (prosthetic) aortic valve. Also, tell them if you have or have had heart disease or endocarditis.  You will be given a card at discharge. The card indicates the type of prosthetic valve that you have. Keep this card in your wallet or purse for quick reference in case of an emergency.  Keep all follow-up visits as told by your health care provider. This is important. Contact a health care provider if:  You develop a skin rash.  You experience sudden, unexplained changes in your weight.  You have redness, swelling, or increasing pain around your incision.  You have fluid or blood coming from your incision.  Your incision feels warm to the touch.  You have pus or a bad smell coming from your incision.  You have a fever. Get help right away if you:  Develop chest pain that is different from the pain coming from your incision.  Develop shortness of breath or difficulty breathing.  Start to feel light-headed. These symptoms may represent a serious problem that is an emergency. Do not wait to see if the  symptoms will go away. Get medical help right away. Call your local emergency services (911 in the [place] Do not drive yourself to the hospital. Summary  After this procedure, it is common to have pain in the incision area.  Eat a heart-healthy diet. Follow instructions about alcohol use.  Get up to walk often. Avoid pushing and pulling with your arms. Ask what activities are safe for you.  Care for your incision as told by your health care provider. Check it daily for signs of infection.  Get help right away if you have chest pain that is different from your incisions, develop shortness of breath or difficulty breathing, or start to feel light-headed. This information is not intended to replace advice given to you by your health care provider. Make sure you discuss any questions you have with your health care provider. Document Revised: 11/07/2017 Document Reviewed: 11/07/2017 Elsevier Patient Education  2020 Elsevier Inc.  

## 2019-04-20 ENCOUNTER — Encounter: Payer: Self-pay | Admitting: Surgery

## 2019-04-23 ENCOUNTER — Ambulatory Visit (INDEPENDENT_AMBULATORY_CARE_PROVIDER_SITE_OTHER): Payer: Self-pay

## 2019-04-23 ENCOUNTER — Other Ambulatory Visit: Payer: Self-pay

## 2019-04-23 DIAGNOSIS — Z4802 Encounter for removal of sutures: Secondary | ICD-10-CM

## 2019-04-23 NOTE — Progress Notes (Signed)
Removed 3 sutures from chest tube incision sites on signs of infection and patient tolerated well.

## 2019-05-06 ENCOUNTER — Other Ambulatory Visit: Payer: Self-pay

## 2019-05-06 ENCOUNTER — Ambulatory Visit (INDEPENDENT_AMBULATORY_CARE_PROVIDER_SITE_OTHER): Payer: Medicare Other | Admitting: Cardiovascular Disease

## 2019-05-06 ENCOUNTER — Encounter: Payer: Self-pay | Admitting: Cardiovascular Disease

## 2019-05-06 VITALS — BP 104/56 | HR 88 | Temp 97.7°F | Ht 70.0 in | Wt 157.0 lb

## 2019-05-06 DIAGNOSIS — E785 Hyperlipidemia, unspecified: Secondary | ICD-10-CM | POA: Insufficient documentation

## 2019-05-06 DIAGNOSIS — I10 Essential (primary) hypertension: Secondary | ICD-10-CM | POA: Diagnosis not present

## 2019-05-06 DIAGNOSIS — E782 Mixed hyperlipidemia: Secondary | ICD-10-CM

## 2019-05-06 DIAGNOSIS — Z952 Presence of prosthetic heart valve: Secondary | ICD-10-CM

## 2019-05-06 NOTE — Patient Instructions (Addendum)
Medication Instructions:  Your physician recommends that you continue on your current medications as directed. Please refer to the Current Medication list given to you today.  If you need a refill on your cardiac medications before your next appointment, please call your pharmacy.   Lab work: [employer]  Testing/Procedures: Your physician has requested that you have an echocardiogram. Echocardiography is a painless test that uses sound waves to create images of your heart. It provides your doctor with information about the size and shape of your heart and how well your heart's chambers and valves are working. This procedure takes approximately one hour. There are no restrictions for this procedure. [place] [address]  Follow-Up: At [facility], you and your health needs are our priority.  As part of our continuing mission to provide you with exceptional heart care, we have created designated Provider Care Teams.  These Care Teams include your primary Cardiologist (physician) and Advanced Practice Providers (APPs -  Physician Assistants and Nurse Practitioners) who all work together to provide you with the care you need, when you need it. You may see Dr. Gwenlyn Found or one of the following Advanced Practice Providers on your designated Care Team:    Kerin Ransom, PA-C  [place], [place]  Coletta Memos, [place]  Your physician wants you to follow-up: as needed.

## 2019-05-06 NOTE — Assessment & Plan Note (Signed)
History of hyperlipidemia on Repatha followed by his PCP. °

## 2019-05-06 NOTE — Assessment & Plan Note (Signed)
History of essential hypertension blood pressure measured today 104/56.  He is on hydrochlorothiazide, lisinopril and metoprolol.

## 2019-05-06 NOTE — Assessment & Plan Note (Signed)
History of aortic valve replacement at [facility] antrostomies [place] April 2015.  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  I performed right and left heart cath on him revealing normal coronary arteries on 01/29/2019 and he ultimately underwent redo AVR by Dr. Cyndia Bent 04/13/2019 using a 21 mm Edwards INSPIRIS RESILIA pericardial valve.  He was discharged home 5 days later.  He is done well since that time.  He is walking and exercising without limitation and feels clinically improved compared to prior to the procedure.  I am going to get a 2D echo to document his new aortic valve and we will see him back as needed.

## 2019-05-06 NOTE — Progress Notes (Signed)
05/06/2019 Tony [name]   09/27/1945  [id number]  Primary Physician Windell Hummingbird, PA-C Primary Cardiologist: Lorretta Harp MD Lupe Carney, [place]  HPI:  Tony [name] is a 74 y.o.  married Caucasian male father of 2 children, grandfather of 67 grandchildren referred by Dr. Claudie Leach for TEE, right left heart cath because of progressive prosthetic aortic valves stenosis and associated symptoms of dyspnea and chest pressure.  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 Mr. Factors include tobacco abuse currently smoking 2 cigars a week previously up to 8-10. He has treated hypertension, diabetes and hyperlipidemia. He is never had a heart attack or stroke. His brother did have bicuspid aortic stenosis and AVR at age 87. 2D echo performed by Dr. Claudie Leach revealed preserved LV function with a worsening aortic valve gradient.  He underwent transesophageal echo by Dr. Stanford Breed revealing normal LV function, severe AS and moderate AI.  Right left heart cath performed by myself the same day showed clean coronary arteries.    He ultimately was evaluated for TAVR and thought to be a better surgical candidate.  He underwent redo aortic valve replacement by Dr. Cyndia Bent 04/13/2019 with a 21 mm Edwards INSPIRIS RESILIA pericardial valve.  Discharged home 5 days later.  He is recuperated nicely and is now is exercising without limitation.  Current Meds  Medication Sig  . aspirin EC 325 MG EC tablet Take 1 tablet (325 mg total) by mouth daily.  . Cyanocobalamin (B-12) 2500 MCG TABS Take 2,500 mcg by mouth 3 (three) times a week.   . escitalopram (LEXAPRO) 20 MG tablet Take 20 mg by mouth daily.   . Evolocumab (REPATHA SURECLICK) [id number] MG/ML SOAJ Inject 140 mg into the skin every 14 (fourteen) days.   . fluticasone (FLONASE) 50 MCG/ACT nasal spray Place 1 spray into both nostrils daily as needed for  allergies or rhinitis.  . hydrochlorothiazide (HYDRODIURIL) 12.5 MG tablet Take 12.5 mg by mouth daily.   . insulin glargine (LANTUS) 100 UNIT/ML injection Inject 0.5 mLs (50 Units total) into the skin daily.  Marland Kitchen lisinopril (ZESTRIL) 20 MG tablet Take 1 tablet (20 mg total) by mouth daily.  Marland Kitchen loperamide (IMODIUM A-D) 2 MG tablet Take 2 mg by mouth as needed for diarrhea or loose stools.  . metFORMIN (GLUCOPHAGE) 1000 MG tablet Take 1,000 mg by mouth 2 (two) times daily with a meal.   . metoprolol tartrate (LOPRESSOR) 50 MG tablet Take 1 tablet (50 mg total) by mouth 2 (two) times daily.  Marland Kitchen omeprazole (PRILOSEC) 20 MG capsule Take 20 mg by mouth daily.  . phenazopyridine (PYRIDIUM) 100 MG tablet Take 1 tablet (100 mg total) by mouth 3 (three) times daily with meals.  . primidone (MYSOLINE) 50 MG tablet Take 25 mg by mouth 2 (two) times daily.  . Probiotic CAPS Take 1 capsule by mouth 3 (three) times a week.  . tamsulosin (FLOMAX) 0.4 MG CAPS capsule Take 1 capsule (0.4 mg total) by mouth daily.  . traMADol (ULTRAM) 50 MG tablet Take 1 tablet (50 mg total) by mouth every 6 (six) hours as needed for moderate pain.     Allergies  Allergen Reactions  . Codeine Other (See Comments) and Rash    Hallucinations     Social History   Socioeconomic History  . Marital status: Married    Spouse name: Not on file  .  Number of children: Not on file  . Years of education: Not on file  . Highest education level: Not on file  Occupational History  . Not on file  Tobacco Use  . Smoking status: Light Tobacco Smoker    Types: Cigars  . Smokeless tobacco: Never Used  . Tobacco comment: cigars 2 a week   Substance and Sexual Activity  . Alcohol use: Yes    Comment: 2-3 [place] per night  . Drug use: Never  . Sexual activity: Not on file  Other Topics Concern  . Not on file  Social History Narrative  . Not on file   Social Determinants of Health   Financial Resource Strain:   . Difficulty of  Paying Living Expenses: Not on file  Food Insecurity:   . Worried About Charity fundraiser in the Last Year: Not on file  . Ran Out of Food in the Last Year: Not on file  Transportation Needs:   . Lack of Transportation (Medical): Not on file  . Lack of Transportation (Non-Medical): Not on file  Physical Activity:   . Days of Exercise per Week: Not on file  . Minutes of Exercise per Session: Not on file  Stress:   . Feeling of Stress : Not on file  Social Connections:   . Frequency of Communication with Friends and Family: Not on file  . Frequency of Social Gatherings with Friends and Family: Not on file  . Attends Religious Services: Not on file  . Active Member of Clubs or Organizations: Not on file  . Attends Archivist Meetings: Not on file  . Marital Status: Not on file  Intimate Partner Violence:   . Fear of Current or Ex-Partner: Not on file  . Emotionally Abused: Not on file  . Physically Abused: Not on file  . Sexually Abused: Not on file     Review of Systems: General: negative for chills, fever, night sweats or weight changes.  Cardiovascular: negative for chest pain, dyspnea on exertion, edema, orthopnea, palpitations, paroxysmal nocturnal dyspnea or shortness of breath Dermatological: negative for rash Respiratory: negative for cough or wheezing Urologic: negative for hematuria Abdominal: negative for nausea, vomiting, diarrhea, bright red blood per rectum, melena, or hematemesis Neurologic: negative for visual changes, syncope, or dizziness All other systems reviewed and are otherwise negative except as noted above.    Blood pressure (!) 104/56, pulse 88, temperature 97.7 F (36.5 C), height 5\' 10"  (1.778 m), weight 157 lb (71.2 kg).  General appearance: alert and no distress Neck: no adenopathy, no carotid bruit, no JVD, supple, symmetrical, trachea midline and thyroid not enlarged, symmetric, no tenderness/mass/nodules Lungs: clear to auscultation  bilaterally Heart: regular rate and rhythm, S1, S2 normal, no murmur, click, rub or gallop Extremities: extremities normal, atraumatic, no cyanosis or edema Pulses: 2+ and symmetric Skin: Skin color, texture, turgor normal. No rashes or lesions Neurologic: Alert and oriented X 3, normal strength and tone. Normal symmetric reflexes. Normal coordination and gait  EKG not performed today  ASSESSMENT AND PLAN:   H/O aortic valve replacement History of aortic valve replacement at [facility] antrostomies [place] April 2015.  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  I performed right and left heart cath on him revealing normal coronary arteries on 01/29/2019 and he ultimately underwent redo AVR by Dr. Cyndia Bent 04/13/2019 using a 21 mm Edwards INSPIRIS RESILIA pericardial valve.  He was discharged home 5 days later.  He is done well since  that time.  He is walking and exercising without limitation and feels clinically improved compared to prior to the procedure.  I am going to get a 2D echo to document his new aortic valve and we will see him back as needed.  Essential hypertension History of essential hypertension blood pressure measured today 104/56.  He is on hydrochlorothiazide, lisinopril and metoprolol.  Hyperlipidemia History of hyperlipidemia on Repatha followed by his PCP      Lorretta Harp MD [facility], [facility] 05/06/2019 2:24 PM

## 2019-05-12 ENCOUNTER — Other Ambulatory Visit: Payer: Self-pay | Admitting: Surgery

## 2019-05-12 DIAGNOSIS — Z952 Presence of prosthetic heart valve: Secondary | ICD-10-CM

## 2019-05-13 ENCOUNTER — Ambulatory Visit
Admission: RE | Admit: 2019-05-13 | Discharge: 2019-05-13 | Disposition: A | Discharge status: Home / Self Care | Payer: Medicare Other | Source: Ambulatory Visit | Attending: Surgery | Admitting: Surgery

## 2019-05-13 ENCOUNTER — Encounter: Payer: Self-pay | Admitting: Surgery

## 2019-05-13 ENCOUNTER — Other Ambulatory Visit: Payer: Self-pay

## 2019-05-13 ENCOUNTER — Ambulatory Visit (INDEPENDENT_AMBULATORY_CARE_PROVIDER_SITE_OTHER): Payer: Self-pay | Admitting: Surgery

## 2019-05-13 VITALS — BP 107/59 | HR 82 | Temp 97.7°F | Resp 20 | Ht 70.0 in | Wt 157.0 lb

## 2019-05-13 DIAGNOSIS — Z952 Presence of prosthetic heart valve: Secondary | ICD-10-CM

## 2019-05-13 DIAGNOSIS — I351 Nonrheumatic aortic (valve) insufficiency: Secondary | ICD-10-CM

## 2019-05-13 DIAGNOSIS — I350 Nonrheumatic aortic (valve) stenosis: Secondary | ICD-10-CM

## 2019-05-13 NOTE — Progress Notes (Signed)
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 The patient's early postoperative recovery while in the hospital was notable for an uncomplicated postoperative course. Since hospital discharge the patient reports that he has been feeling well.  He has started cardiac rehab and said that he has had no shortness of breath.  His stamina continues to improve.  He said that he has not felt this well in the past few years.   Current Outpatient Medications  Medication Sig Dispense Refill  . aspirin EC 325 MG EC tablet Take 1 tablet (325 mg total) by mouth daily. 30 tablet 0  . Cyanocobalamin (B-12) 2500 MCG TABS Take 2,500 mcg by mouth 3 (three) times a week.     . escitalopram (LEXAPRO) 20 MG tablet Take 20 mg by mouth daily.     . Evolocumab (REPATHA SURECLICK) [id number] MG/ML SOAJ Inject 140 mg into the skin every 14 (fourteen) days.     . fluticasone (FLONASE) 50 MCG/ACT nasal spray Place 1 spray into both nostrils daily as needed for allergies or rhinitis.    . hydrochlorothiazide (HYDRODIURIL) 12.5 MG tablet Take 12.5 mg by mouth daily.     . insulin glargine (LANTUS) 100 UNIT/ML injection Inject 0.5 mLs (50 Units total) into the skin daily. 10 mL 11  . lisinopril (ZESTRIL) 20 MG tablet Take 1 tablet (20 mg total) by mouth daily. 30 tablet 1  . loperamide (IMODIUM A-D) 2 MG tablet Take 2 mg by mouth as needed for diarrhea or loose stools.    . metFORMIN (GLUCOPHAGE) 1000 MG tablet Take 1,000 mg by mouth 2 (two) times daily with a meal.     . metoprolol tartrate (LOPRESSOR) 50 MG tablet Take 1 tablet (50 mg total) by mouth 2 (two) times daily. 60 tablet 1  . omeprazole (PRILOSEC) 20 MG capsule Take 20 mg by mouth daily.    . phenazopyridine (PYRIDIUM) 100 MG tablet Take 1 tablet (100 mg total) by mouth 3 (three) times daily with meals. 10 tablet 0  . primidone (MYSOLINE) 50 MG tablet Take  25 mg by mouth 2 (two) times daily.    . Probiotic CAPS Take 1 capsule by mouth 3 (three) times a week.    . tamsulosin (FLOMAX) 0.4 MG CAPS capsule Take 1 capsule (0.4 mg total) by mouth daily. 30 capsule 1  . traMADol (ULTRAM) 50 MG tablet Take 1 tablet (50 mg total) by mouth every 6 (six) hours as needed for moderate pain. 30 tablet 0   No current facility-administered medications for this visit.    Physical Exam: BP (!) 107/59   Pulse 82   Temp 97.7 F (36.5 C) (Skin)   Resp 20   Ht 5\' 10"  (1.778 m)   Wt 157 lb (71.2 kg)   SpO2 95% Comment: RA  BMI 22.53 kg/m  He looks well. Cardiac exam shows a regular rate and rhythm with a 1/6 systolic flow murmur across the aortic valve prosthesis.  There is no diastolic murmur. Lungs are clear. Chest incision is healing well and the sternum is stable. There is no peripheral edema  Diagnostic Tests:  Chest x-ray today shows clear lung fields and no pleural effusions  Impression:  He is doing well 1 month following redo aortic valve replacement surgery.  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  He has been scheduled for a follow-up baseline echocardiogram by Dr. Gwenlyn Found.  Plan:  He will continue to follow-up with cardiology and his PCP.  He will return to see me if he has any problems with his incisions.   Gaye Pollack, MD [facility] [phone]

## 2019-05-22 ENCOUNTER — Other Ambulatory Visit: Payer: Self-pay

## 2019-05-22 ENCOUNTER — Ambulatory Visit (HOSPITAL_COMMUNITY): Payer: Medicare Other | Attending: Cardiology

## 2019-05-22 DIAGNOSIS — Z952 Presence of prosthetic heart valve: Secondary | ICD-10-CM | POA: Diagnosis present

## 2019-05-25 ENCOUNTER — Other Ambulatory Visit: Payer: Self-pay

## 2019-05-25 DIAGNOSIS — I350 Nonrheumatic aortic (valve) stenosis: Secondary | ICD-10-CM

## 2019-05-25 DIAGNOSIS — Z952 Presence of prosthetic heart valve: Secondary | ICD-10-CM

## 2019-05-26 ENCOUNTER — Telehealth: Payer: Self-pay | Admitting: Cardiovascular Disease

## 2019-05-26 NOTE — Telephone Encounter (Signed)
Spoke with patient to schedule Echo for March 2022,  Patient requests we call him closer to the due date for the Echo.

## 2020-06-02 ENCOUNTER — Ambulatory Visit (HOSPITAL_COMMUNITY): Payer: Medicare Other | Attending: Cardiovascular Disease

## 2020-06-02 ENCOUNTER — Other Ambulatory Visit: Payer: Self-pay

## 2020-06-02 DIAGNOSIS — Z952 Presence of prosthetic heart valve: Secondary | ICD-10-CM | POA: Insufficient documentation

## 2020-06-02 DIAGNOSIS — I350 Nonrheumatic aortic (valve) stenosis: Secondary | ICD-10-CM | POA: Diagnosis present

## 2020-06-02 LAB — ECHOCARDIOGRAM COMPLETE
AV Mean grad: 19 mmHg
AV Peak grad: 32.5 mmHg
Ao pk vel: 2.85 m/s
Area-P 1/2: 3.65 cm2
P 1/2 time: 240 msec
S' Lateral: 3.1 cm

## 2021-06-18 IMAGING — CR DG CHEST 2V
2 series · 2 of 2 positions shown · non-contrast
Comparison: April 15, 2019

CLINICAL DATA: Status post aortic valve replacement

EXAM:
CHEST - 2 VIEW

[w chest pa]
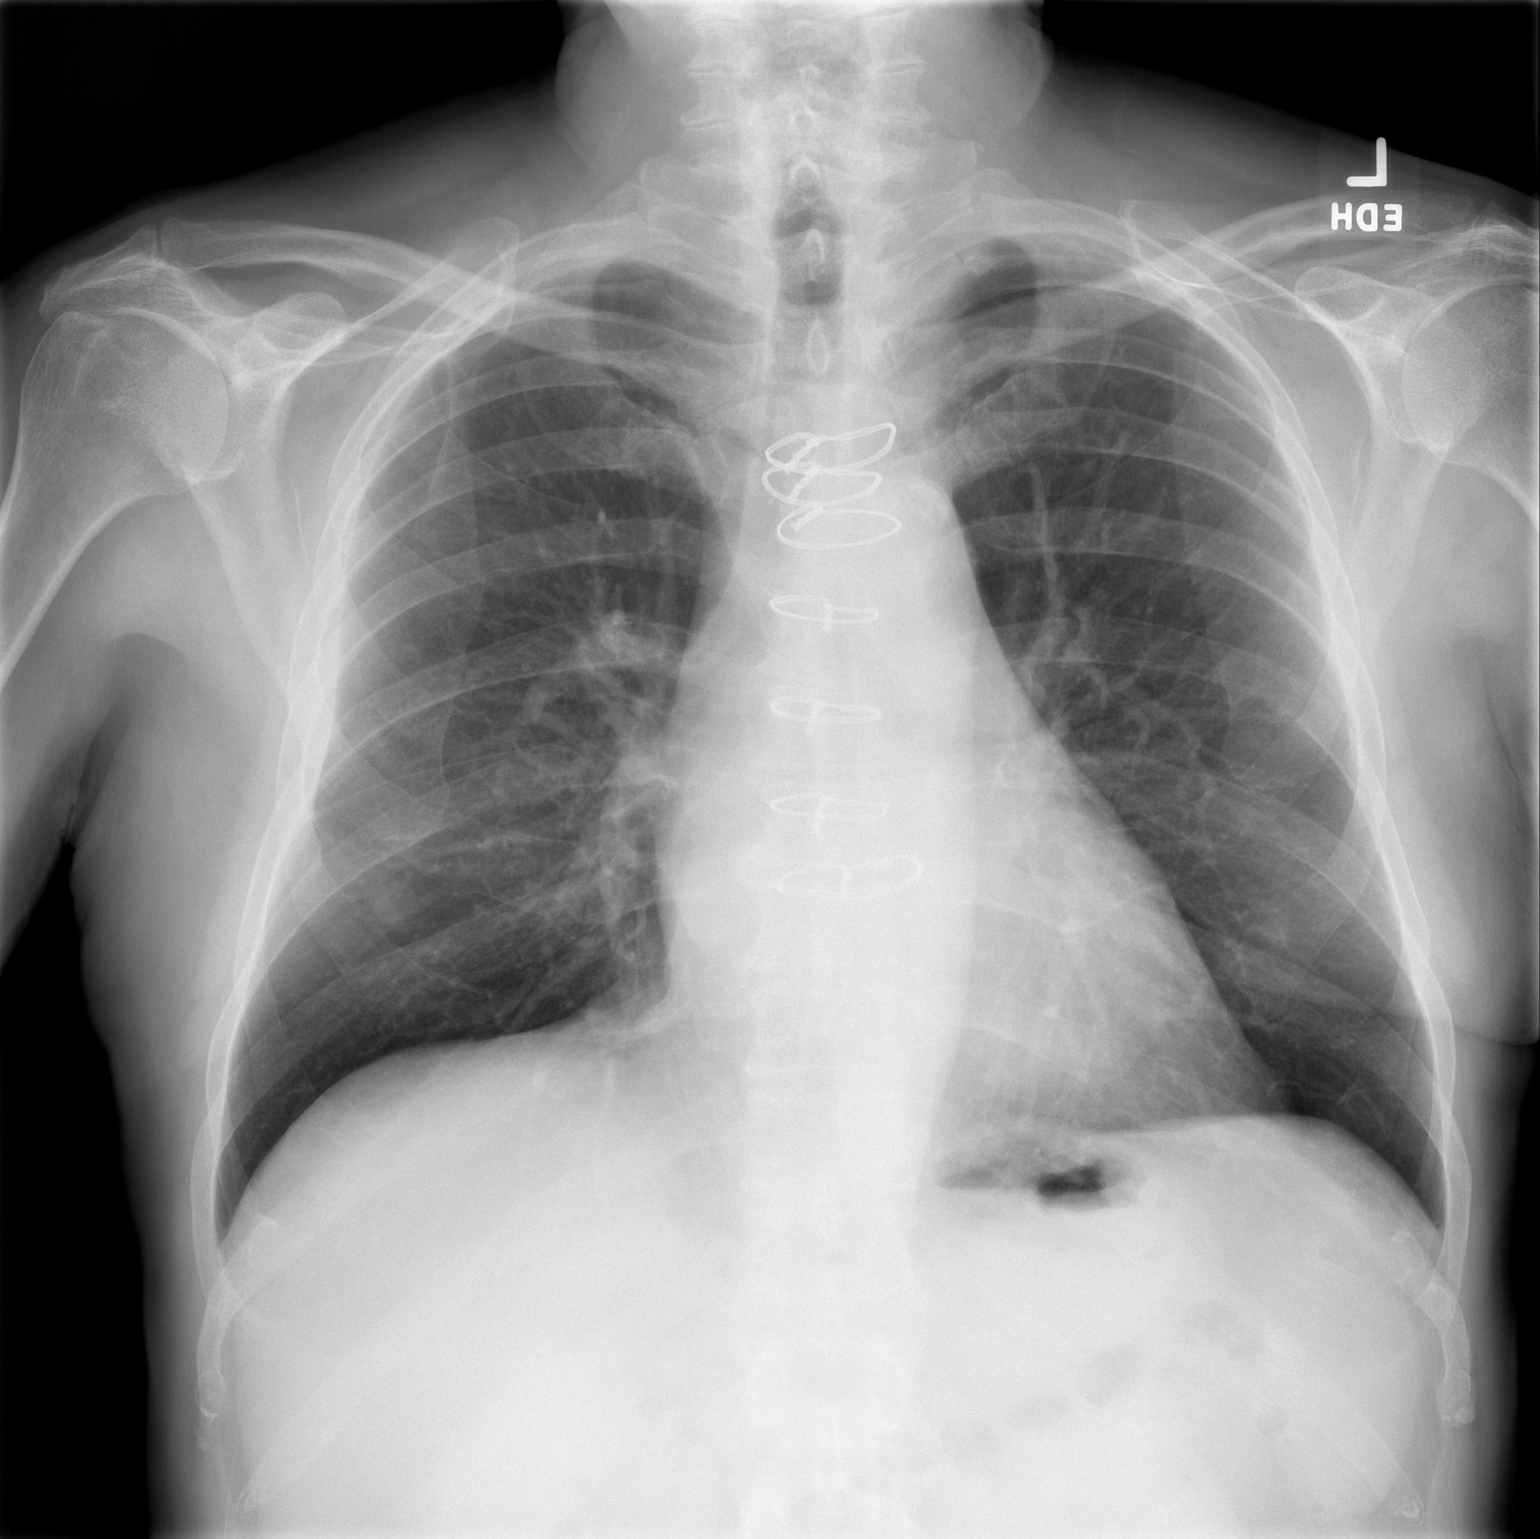

[w chest lat]
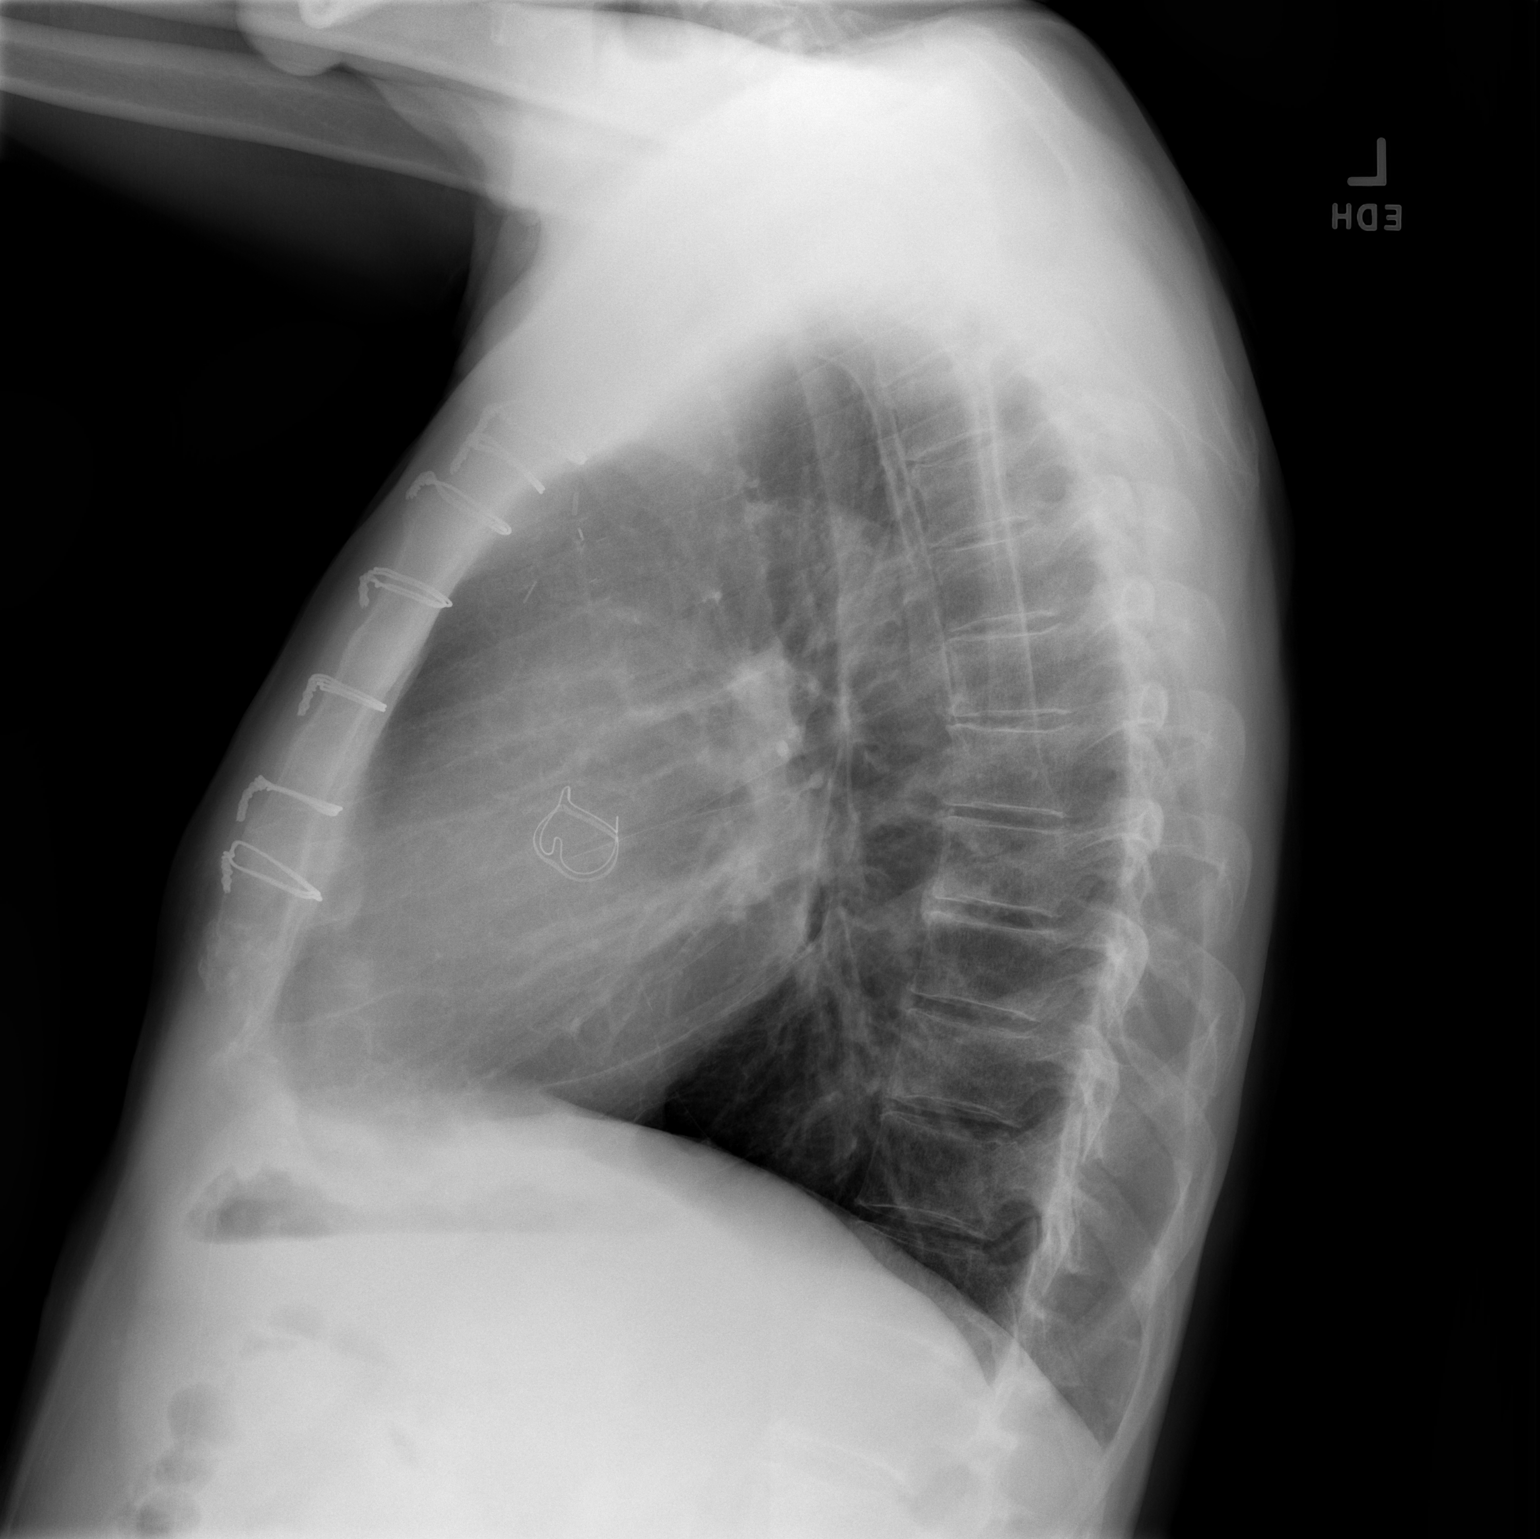

[2 of 2 positions shown; findings below may reference images not displayed]

FINDINGS: There is an apparent nipple shadow on each side. Lungs otherwise
clear. Heart size and pulmonary vascularity are normal. Patient is
status post aortic valve replacement. No pneumothorax. No
adenopathy. No bone lesions.
IMPRESSION: Status post aortic valve replacement. Heart size normal. Apparent
nipple shadow on each side; repeat study with nipple markers could
confirm. Lungs otherwise clear.
